# Patient Record
Sex: Female | Born: 1990 | Race: White | Hispanic: No | Marital: Single | Smoking: Never smoker
Health system: Southern US, Community
[De-identification: ages and names within clinical notes are randomized; demographics above are authoritative.]

## PROBLEM LIST (undated history)

## (undated) DIAGNOSIS — G44209 Tension-type headache, unspecified, not intractable: Secondary | ICD-10-CM

## (undated) DIAGNOSIS — F410 Panic disorder [episodic paroxysmal anxiety] without agoraphobia: Secondary | ICD-10-CM

## (undated) HISTORY — PX: WISDOM TOOTH EXTRACTION: SHX21

---

## 2012-07-18 ENCOUNTER — Encounter (HOSPITAL_COMMUNITY): Payer: Self-pay | Admitting: Family Medicine

## 2012-07-18 ENCOUNTER — Emergency Department (HOSPITAL_COMMUNITY)
Admission: EM | Admit: 2012-07-18 | Discharge: 2012-07-18 | Disposition: A | Discharge status: Home / Self Care | Payer: Managed Care, Other (non HMO) | Attending: Emergency Medicine | Admitting: Emergency Medicine

## 2012-07-18 ENCOUNTER — Emergency Department (HOSPITAL_COMMUNITY): Payer: Managed Care, Other (non HMO)

## 2012-07-18 DIAGNOSIS — R112 Nausea with vomiting, unspecified: Secondary | ICD-10-CM | POA: Insufficient documentation

## 2012-07-18 DIAGNOSIS — R197 Diarrhea, unspecified: Secondary | ICD-10-CM

## 2012-07-18 DIAGNOSIS — Z3202 Encounter for pregnancy test, result negative: Secondary | ICD-10-CM | POA: Insufficient documentation

## 2012-07-18 DIAGNOSIS — R109 Unspecified abdominal pain: Secondary | ICD-10-CM

## 2012-07-18 DIAGNOSIS — R1084 Generalized abdominal pain: Secondary | ICD-10-CM | POA: Insufficient documentation

## 2012-07-18 LAB — CBC WITH DIFFERENTIAL/PLATELET
Basophils Absolute: 0 10*3/uL (ref 0.0–0.1)
Basophils Relative: 0 % (ref 0–1)
Eosinophils Absolute: 0 10*3/uL (ref 0.0–0.7)
Eosinophils Relative: 0 % (ref 0–5)
HCT: 38.7 % (ref 36.0–46.0)
Hemoglobin: 13.1 g/dL (ref 12.0–15.0)
Lymphocytes Relative: 8 % — ABNORMAL LOW (ref 12–46)
Lymphs Abs: 0.4 10*3/uL — ABNORMAL LOW (ref 0.7–4.0)
MCH: 29.6 pg (ref 26.0–34.0)
MCHC: 33.9 g/dL (ref 30.0–36.0)
MCV: 87.4 fL (ref 78.0–100.0)
Monocytes Absolute: 0.3 10*3/uL (ref 0.1–1.0)
Monocytes Relative: 5 % (ref 3–12)
Neutro Abs: 4.6 10*3/uL (ref 1.7–7.7)
Neutrophils Relative %: 87 % — ABNORMAL HIGH (ref 43–77)
Platelets: 175 10*3/uL (ref 150–400)
RBC: 4.43 MIL/uL (ref 3.87–5.11)
RDW: 12.8 % (ref 11.5–15.5)
WBC: 5.3 10*3/uL (ref 4.0–10.5)

## 2012-07-18 LAB — POCT I-STAT, CHEM 8
Glucose, Bld: 138 mg/dL — ABNORMAL HIGH (ref 70–99)
HCT: 45 % (ref 36.0–46.0)
Hemoglobin: 15.3 g/dL — ABNORMAL HIGH (ref 12.0–15.0)
Potassium: 3.6 mEq/L (ref 3.5–5.1)
Sodium: 141 mEq/L (ref 135–145)
TCO2: 21 mmol/L (ref 0–100)

## 2012-07-18 LAB — URINALYSIS, ROUTINE W REFLEX MICROSCOPIC
Nitrite: NEGATIVE
Specific Gravity, Urine: 1.019 (ref 1.005–1.030)
Urobilinogen, UA: 0.2 mg/dL (ref 0.0–1.0)

## 2012-07-18 LAB — COMPREHENSIVE METABOLIC PANEL
ALT: 11 U/L (ref 0–35)
AST: 14 U/L (ref 0–37)
Albumin: 3 g/dL — ABNORMAL LOW (ref 3.5–5.2)
Alkaline Phosphatase: 38 U/L — ABNORMAL LOW (ref 39–117)
BUN: 4 mg/dL — ABNORMAL LOW (ref 6–23)
CO2: 19 mEq/L (ref 19–32)
Calcium: 7.7 mg/dL — ABNORMAL LOW (ref 8.4–10.5)
Chloride: 108 mEq/L (ref 96–112)
Creatinine, Ser: 0.59 mg/dL (ref 0.50–1.10)
GFR calc Af Amer: >90 mL/min (ref >90)
GFR calc non Af Amer: >90 mL/min (ref >90)
Glucose, Bld: 105 mg/dL — ABNORMAL HIGH (ref 70–99)
Potassium: 3.5 mEq/L (ref 3.5–5.1)
Sodium: 139 mEq/L (ref 135–145)
Total Bilirubin: 0.7 mg/dL (ref 0.3–1.2)
Total Protein: 5.8 g/dL — ABNORMAL LOW (ref 6.0–8.3)

## 2012-07-18 LAB — WET PREP, GENITAL
Clue Cells Wet Prep HPF POC: NONE SEEN
Trich, Wet Prep: NONE SEEN
Yeast Wet Prep HPF POC: NONE SEEN

## 2012-07-18 LAB — CG4 I-STAT (LACTIC ACID): Lactic Acid, Venous: 1.48 mmol/L (ref 0.5–2.2)

## 2012-07-18 MED ORDER — MORPHINE SULFATE 4 MG/ML IJ SOLN
4.0000 mg | Freq: Once | INTRAMUSCULAR | Status: AC
  Administered 2012-07-18: 4 mg via INTRAVENOUS
  Filled 2012-07-18: qty 1

## 2012-07-18 MED ORDER — ONDANSETRON HCL 4 MG/2ML IJ SOLN: 4.0000 mg | Freq: Once | INTRAMUSCULAR | Status: DC

## 2012-07-18 MED ORDER — SODIUM CHLORIDE 0.9 % IV BOLUS (SEPSIS)
1000.0000 mL | Freq: Once | INTRAVENOUS | Status: AC
  Administered 2012-07-18: 1000 mL via INTRAVENOUS

## 2012-07-18 MED ORDER — IOHEXOL 300 MG/ML  SOLN
50.0000 mL | Freq: Once | INTRAMUSCULAR | Status: AC | PRN
  Administered 2012-07-18: 50 mL via INTRAVENOUS

## 2012-07-18 MED ORDER — ONDANSETRON HCL 4 MG PO TABS: 4.0000 mg | ORAL_TABLET | Freq: Three times a day (TID) | ORAL | Status: DC | PRN

## 2012-07-18 MED ORDER — DICYCLOMINE HCL 20 MG PO TABS: 20.0000 mg | ORAL_TABLET | Freq: Two times a day (BID) | ORAL | Status: DC | PRN

## 2012-07-18 MED ORDER — TRAMADOL HCL 50 MG PO TABS: 50.0000 mg | ORAL_TABLET | Freq: Four times a day (QID) | ORAL | Status: DC | PRN

## 2012-07-18 MED ORDER — IOHEXOL 300 MG/ML  SOLN
100.0000 mL | Freq: Once | INTRAMUSCULAR | Status: AC | PRN
  Administered 2012-07-18: 100 mL via INTRAVENOUS

## 2012-07-18 MED ORDER — KETOROLAC TROMETHAMINE 30 MG/ML IJ SOLN
15.0000 mg | Freq: Once | INTRAMUSCULAR | Status: AC
  Administered 2012-07-18: 15 mg via INTRAVENOUS
  Filled 2012-07-18: qty 1

## 2012-07-18 MED ORDER — ONDANSETRON HCL 4 MG/2ML IJ SOLN
4.0000 mg | Freq: Once | INTRAMUSCULAR | Status: AC
  Administered 2012-07-18: 4 mg via INTRAVENOUS
  Filled 2012-07-18: qty 2

## 2012-07-18 MED ORDER — LOPERAMIDE HCL 2 MG PO CAPS
2.0000 mg | ORAL_CAPSULE | Freq: Four times a day (QID) | ORAL | Status: DC | PRN
Start: 2012-07-18 — End: 2012-10-15

## 2012-07-18 NOTE — ED Provider Notes (Signed)
History     CSN: [id number]  Arrival date & time 07/18/12  0402   First MD Initiated Contact with Patient 07/18/12 [phone]      Chief Complaint  Patient presents with  . Nausea  . Emesis  . Diarrhea    (Consider location/radiation/quality/duration/timing/severity/associated sxs/prior treatment) HPI Sherry Love is a 22 y.o. female with no significant past medical history presents emergency department complaining of nausea vomiting and diarrhea.  Onset of symptoms began approximately 5 hours prior to arrival.  Patient actively vomiting in triage.  Patient describes diffuse abdominal cramping but denies any localized abdominal pain.  She denies any fevers night sweats, chills, sick contacts, recent travel, chest pain, shortness of breath.  No other known associated symptoms.  History reviewed. No pertinent past medical history.  Past Surgical History  Procedure Laterality Date  . Wisdom tooth extraction      No family history on file.  History  Substance Use Topics  . Smoking status: Not on file  . Smokeless tobacco: Not on file  . Alcohol Use: Yes     Comment: Ocassional     OB History   Grav Para Term Preterm Abortions TAB SAB Ect Mult Living                  Review of Systems Ten systems reviewed and are negative for acute change, except as noted in the HPI.    Allergies  Penicillins  Home Medications   Current Outpatient Rx  Name  Route  Sig  Dispense  Refill  . butalbital-acetaminophen-caffeine (FIORICET, ESGIC) 50-325-40 MG per tablet   Oral   Take 1 tablet by mouth 2 (two) times daily as needed for headache.         . Drospirenone-Ethinyl Estradiol (VESTURA PO)   Oral   Take 1 tablet by mouth every morning.           BP 127/78  Pulse 111  Temp(Src) 97.9 F (36.6 C)  Resp 18  SpO2 99%  LMP 07/08/2012  Physical Exam  Nursing note and vitals reviewed. Constitutional: She is oriented to person, place, and time. She appears well-developed and  well-nourished. No distress.  HENT:  Head: Normocephalic and atraumatic.  Mouth/Throat: Oropharynx is clear and moist. No oropharyngeal exudate.  Eyes: Conjunctivae and EOM are normal. Pupils are equal, round, and reactive to light. No scleral icterus.  Neck: Normal range of motion. Neck supple. No tracheal deviation present. No thyromegaly present.  Cardiovascular: Regular rhythm, normal heart sounds and intact distal pulses.   Tachycardic  Pulmonary/Chest: Effort normal and breath sounds normal. No stridor. No respiratory distress. She has no wheezes.  Abdominal: Soft. There is tenderness (diffuse).  Musculoskeletal: Normal range of motion. She exhibits no edema and no tenderness.  Neurological: She is alert and oriented to person, place, and time. Coordination normal.  Skin: Skin is warm and dry. No rash noted. She is not diaphoretic. No erythema. No pallor.  Psychiatric: She has a normal mood and affect. Her behavior is normal.    ED Course  Procedures (including critical care time)  Labs Reviewed  POCT I-STAT, CHEM 8 - Abnormal; Notable for the following:    Glucose, Bld 138 (*)    Hemoglobin 15.3 (*)    All other components within normal limits  URINALYSIS, ROUTINE W REFLEX MICROSCOPIC   No results found.   No diagnosis found.    MDM  Patient with symptoms consistent with viral gastroenteritis.  Vitals are stable, no  fever.  No signs of dehydration, tolerating PO fluids > 6 oz.  Lungs are clear.  No focal abdominal pain, no concern for appendicitis, cholecystitis, pancreatitis, ruptured viscus, UTI, kidney stone, or any other abdominal etiology.  Supportive therapy indicated with return if symptoms worsen.  Patient counseled.         Jaci Carrel, [place] 07/21/12 [phone]

## 2012-07-18 NOTE — ED Provider Notes (Signed)
7:14 AM Sign out received from [facility] [place], [place], at change of shift.  Patient is young, healthy woman with N/V/D that began a few hours prior to arrival.  Plan is for IVF, symptom management, PO trial, and d/c home.  Pt reports she is feeling much better, not currently vomiting, abdominal pain remains diffuse.  Pt is tachycardic in 110s.  Will continue IVF.  Abdomen is nondistended, soft, diffusely tender, no guarding, no rebound. No hx abdominal surgeries.   9:34 AM Pt remains tachycardic to 122.  Abdomen remains benign: soft, nondistended, diffusely tender, worse in lower abdomen, no guarding, no rebound.  No tolerating PO.  Denies CP, SOB, cough.  Will continue IVF, PO fluids.    11:01 AM Pt remains tachycardic at 118.  Abdominal exam remains unchanged.  No longer vomiting.  Continues to have diarrhea.  I have discussed pt with Dr Juleen China who will also see and examine patient.  I have discussed strict return precautions with patient for fever, uncontrolled vomiting, localizing or changing abdominal pain.    11:14 AM Pt has been seen by and examined by Dr Juleen China who will perform pelvic exam and has ordered CT abd/pelvis.  Further treatment and dispo by Dr Juleen China.    Results for orders placed during the hospital encounter of 07/18/12  WET PREP, GENITAL      Result Value Range   Yeast Wet Prep HPF POC NONE SEEN  NONE SEEN   Trich, Wet Prep NONE SEEN  NONE SEEN   Clue Cells Wet Prep HPF POC NONE SEEN  NONE SEEN   WBC, Wet Prep HPF POC FEW (*) NONE SEEN  URINALYSIS, ROUTINE W REFLEX MICROSCOPIC      Result Value Range   Color, Urine YELLOW  YELLOW   APPearance CLEAR  CLEAR   Specific Gravity, Urine 1.019  1.005 - 1.030   pH 5.0  5.0 - 8.0   Glucose, UA NEGATIVE  NEGATIVE mg/dL   Hgb urine dipstick NEGATIVE  NEGATIVE   Bilirubin Urine NEGATIVE  NEGATIVE   Ketones, ur 40 (*) NEGATIVE mg/dL   Protein, ur NEGATIVE  NEGATIVE mg/dL   Urobilinogen, UA 0.2  0.0 - 1.0 mg/dL   Nitrite NEGATIVE  NEGATIVE    Leukocytes, UA NEGATIVE  NEGATIVE  COMPREHENSIVE METABOLIC PANEL      Result Value Range   Sodium 139  135 - 145 mEq/L   Potassium 3.5  3.5 - 5.1 mEq/L   Chloride 108  96 - 112 mEq/L   CO2 19  19 - 32 mEq/L   Glucose, Bld 105 (*) 70 - 99 mg/dL   BUN 4 (*) 6 - 23 mg/dL   Creatinine, Ser 9.60  0.50 - 1.10 mg/dL   Calcium 7.7 (*) 8.4 - 10.5 mg/dL   Total Protein 5.8 (*) 6.0 - 8.3 g/dL   Albumin 3.0 (*) 3.5 - 5.2 g/dL   AST 14  0 - 37 U/L   ALT 11  0 - 35 U/L   Alkaline Phosphatase 38 (*) 39 - 117 U/L   Total Bilirubin 0.7  0.3 - 1.2 mg/dL   GFR calc non Af Amer >90  >90 mL/min   GFR calc Af Amer >90  >90 mL/min  CBC WITH DIFFERENTIAL      Result Value Range   WBC 5.3  4.0 - 10.5 K/uL   RBC 4.43  3.87 - 5.11 MIL/uL   Hemoglobin 13.1  12.0 - 15.0 g/dL   HCT 45.4  09.8 -  46.0 %   MCV 87.4  78.0 - 100.0 fL   MCH 29.6  26.0 - 34.0 pg   MCHC 33.9  30.0 - 36.0 g/dL   RDW 16.1  09.6 - 04.5 %   Platelets 175  150 - 400 K/uL   Neutrophils Relative % 87 (*) 43 - 77 %   Neutro Abs 4.6  1.7 - 7.7 K/uL   Lymphocytes Relative 8 (*) 12 - 46 %   Lymphs Abs 0.4 (*) 0.7 - 4.0 K/uL   Monocytes Relative 5  3 - 12 %   Monocytes Absolute 0.3  0.1 - 1.0 K/uL   Eosinophils Relative 0  0 - 5 %   Eosinophils Absolute 0.0  0.0 - 0.7 K/uL   Basophils Relative 0  0 - 1 %   Basophils Absolute 0.0  0.0 - 0.1 K/uL  POCT I-STAT, CHEM 8      Result Value Range   Sodium 141  135 - 145 mEq/L   Potassium 3.6  3.5 - 5.1 mEq/L   Chloride 109  96 - 112 mEq/L   BUN 8  6 - 23 mg/dL   Creatinine, Ser 4.09  0.50 - 1.10 mg/dL   Glucose, Bld 811 (*) 70 - 99 mg/dL   Calcium, Ion 9.14  7.82 - 1.23 mmol/L   TCO2 21  0 - 100 mmol/L   Hemoglobin 15.3 (*) 12.0 - 15.0 g/dL   HCT 95.6  21.3 - 08.6 %  POCT PREGNANCY, URINE      Result Value Range   Preg Test, Ur NEGATIVE  NEGATIVE  CG4 I-STAT (LACTIC ACID)      Result Value Range   Lactic Acid, Venous 1.48  0.5 - 2.2 mmol/L   Ct Abdomen Pelvis W  Contrast  07/18/2012   *RADIOLOGY REPORT*  Clinical Data: Lower abdominal pain and emesis.  Nausea vomiting and diarrhea for past 5 hours.  CT ABDOMEN AND PELVIS WITH CONTRAST  Technique:  Multidetector CT imaging of the abdomen and pelvis was performed following the standard protocol during bolus administration of intravenous contrast.  Contrast: 50mL OMNIPAQUE IOHEXOL 300 MG/ML  SOLN, OMNIPAQUE IOHEXOL 300 MG/ML  SOLN  Comparison: None.  Findings: The lung bases are clear.  Heart size is normal. Negative for pleural or pericardial effusion.  Focal non mass-like area of hypodensity adjacent to the falciform ligament of the liver likely reflects focal fatty infiltration.  No suspicious hepatic mass.  Liver normal in size.  Portal vein is patent.  Negative for biliary ductal dilatation.  The spleen, adrenal glands, kidneys, and pancreas are normal. Distal small bowel loops and the colon contain fluid, consistent with history of diarrhea. Bowel loops are not distended.  There is no focal bowel wall thickening.  The appendix is visualized, normal in caliber 4 mm, and there are no peri-appendiceal inflammatory changes.  Urinary bladder, uterus, and adnexa are normal. Abdominal aorta normal in caliber.  Vertebral bodies are normal in height and alignment.  No acute or suspicious bony abnormality.  IMPRESSION:  1. 1. Fluid within small bowel and colon is consistent with history of diarrhea.  No evidence of bowel obstruction or bowel wall thickening. 2.  Normal caliber appendix. 3.  Negative for mass lesion or lymphadenopathy.   Original Report Authenticated By: Britta Mccreedy, M.D.      Trixie Dredge, PA-C 07/18/12 1355

## 2012-07-18 NOTE — ED Notes (Signed)
Patient forgot to catch urine

## 2012-07-18 NOTE — ED Provider Notes (Signed)
Medical screening examination/treatment/procedure(s) were conducted as a shared visit with non-physician practitioner(s) and myself.  I personally evaluated the patient during the encounter.  11:17 AM  Pt evaluated. She is diffusely tender. This is worse across lower abdomen, but does not lateralize. Pt significantly tachycardic and hypotensive on arrival. BP has improved. HR still up to 130s while I was in the room despite 3L NS. No significant past medical or surgical history. Pt is sexually active. She denies any abdominal pain or vaginal discharge. Pregnancy test negative. This may be a viral gastroenteritis, but I have concerns about amount of tenderness I am eliciting on my exam. Will obtain additional labs. Pelvic examination. CT a/p. Continue to monitor/reassess.  12:03 PM Pelvic with normal external female genitalia. No concerning lesions noted. Pt does have cervicitis, but not particularly a lot of discharge. Mild CMT/R adnexal tender. Recommended STD tx to pt. She is deferring until cultures completed. Understands possibility of false negative.   Raeford Razor, MD 07/20/12 [phone]

## 2012-07-18 NOTE — ED Notes (Signed)
Patient states that she has had nausea, vomiting and diarrhea for the past 5 hours. Patient actively vomiting at triage.

## 2012-07-18 NOTE — ED Notes (Signed)
Pt sts she is unable to urinate.

## 2012-07-18 NOTE — ED Notes (Signed)
Pt has ambulated to bathroom to void.  Unassisted but mother walking with her.

## 2012-07-19 LAB — GC/CHLAMYDIA PROBE AMP
CT Probe RNA: NEGATIVE
GC Probe RNA: NEGATIVE

## 2012-07-20 NOTE — ED Provider Notes (Signed)
Medical screening examination/treatment/procedure(s) were conducted as a shared visit with non-physician practitioner(s) and myself.  I personally evaluated the patient during the encounter.  Please see completed note.  Raeford Razor, MD 07/20/12 1344

## 2012-07-24 LAB — CULTURE, BLOOD (ROUTINE X 2)
Culture: NO GROWTH
Culture: NO GROWTH

## 2012-08-02 NOTE — ED Provider Notes (Signed)
Medical screening examination/treatment/procedure(s) were performed by non-physician practitioner and as supervising physician I was immediately available for consultation/collaboration.  John-Adam Reni Hausner, M.D.   John-Adam Alfonso Shackett, MD 08/02/12 0208 

## 2012-10-15 ENCOUNTER — Encounter (HOSPITAL_COMMUNITY): Payer: Self-pay | Admitting: Emergency Medicine

## 2012-10-15 ENCOUNTER — Emergency Department (HOSPITAL_COMMUNITY)
Admission: EM | Admit: 2012-10-15 | Discharge: 2012-10-15 | Disposition: A | Discharge status: Home / Self Care | Payer: Managed Care, Other (non HMO) | Attending: Emergency Medicine | Admitting: Emergency Medicine

## 2012-10-15 DIAGNOSIS — K529 Noninfective gastroenteritis and colitis, unspecified: Secondary | ICD-10-CM

## 2012-10-15 DIAGNOSIS — Z8659 Personal history of other mental and behavioral disorders: Secondary | ICD-10-CM | POA: Insufficient documentation

## 2012-10-15 DIAGNOSIS — Z880 Allergy status to penicillin: Secondary | ICD-10-CM | POA: Insufficient documentation

## 2012-10-15 DIAGNOSIS — R197 Diarrhea, unspecified: Secondary | ICD-10-CM | POA: Insufficient documentation

## 2012-10-15 DIAGNOSIS — R112 Nausea with vomiting, unspecified: Secondary | ICD-10-CM | POA: Insufficient documentation

## 2012-10-15 DIAGNOSIS — K5289 Other specified noninfective gastroenteritis and colitis: Secondary | ICD-10-CM | POA: Insufficient documentation

## 2012-10-15 HISTORY — DX: Tension-type headache, unspecified, not intractable: G44.209

## 2012-10-15 HISTORY — DX: Panic disorder (episodic paroxysmal anxiety): F41.0

## 2012-10-15 LAB — BASIC METABOLIC PANEL
BUN: 7 mg/dL (ref 6–23)
CO2: 20 mEq/L (ref 19–32)
Calcium: 9 mg/dL (ref 8.4–10.5)
Glucose, Bld: 92 mg/dL (ref 70–99)
Sodium: 134 mEq/L — ABNORMAL LOW (ref 135–145)

## 2012-10-15 LAB — URINALYSIS, ROUTINE W REFLEX MICROSCOPIC
Bilirubin Urine: NEGATIVE
Glucose, UA: NEGATIVE mg/dL
Hgb urine dipstick: NEGATIVE
Ketones, ur: NEGATIVE mg/dL
Protein, ur: NEGATIVE mg/dL

## 2012-10-15 LAB — URINE MICROSCOPIC-ADD ON

## 2012-10-15 MED ORDER — SODIUM CHLORIDE 0.9 % IV BOLUS (SEPSIS)
1000.0000 mL | Freq: Once | INTRAVENOUS | Status: AC
  Administered 2012-10-15: 1000 mL via INTRAVENOUS

## 2012-10-15 MED ORDER — ONDANSETRON HCL 4 MG/2ML IJ SOLN
4.0000 mg | Freq: Once | INTRAMUSCULAR | Status: AC
  Administered 2012-10-15: 4 mg via INTRAVENOUS
  Filled 2012-10-15: qty 2

## 2012-10-15 MED ORDER — ONDANSETRON 4 MG PO TBDP: ORAL_TABLET | ORAL | Status: DC

## 2012-10-15 MED ORDER — LORAZEPAM 2 MG/ML IJ SOLN: 1.0000 mg | Freq: Once | INTRAMUSCULAR | Status: DC

## 2012-10-15 NOTE — ED Notes (Addendum)
Patient reports that yesterday she began feeling abdominal pain, then later got dizzy in the shower and "passed out for 5 seconds," but was caught by boyfriend before falling. Patient reports that she felt a panic attack after she woke up and that that yesterday began symptoms of nausea and vomiting after this event. Patient reports some SOB yesterday, which she believes was due to anxiety.

## 2012-10-15 NOTE — ED Provider Notes (Signed)
CSN: [id number]     Arrival date & time 10/15/12  1245 History     First MD Initiated Contact with Patient 10/15/12 1308     Chief Complaint  Patient presents with  . Abdominal Pain  . Emesis   (Consider location/radiation/quality/duration/timing/severity/associated sxs/prior Treatment) Patient is a 22 y.o. female presenting with vomiting. The history is provided by the patient.  Emesis Severity:  Moderate Duration:  2 days Timing:  Constant Number of daily episodes:  5 Quality:  Stomach contents Feeding tolerance: nothing. Progression:  Unchanged Chronicity:  New Recent urination:  Normal Context: not post-tussive   Relieved by:  Nothing Worsened by:  Nothing tried Ineffective treatments:  None tried Associated symptoms: diarrhea   Associated symptoms: no abdominal pain, no chills, no cough and no fever     Past Medical History  Diagnosis Date  . Anxiety attack   . Tension headache    Past Surgical History  Procedure Laterality Date  . Wisdom tooth extraction     No family history on file. History  Substance Use Topics  . Smoking status: Not on file  . Smokeless tobacco: Not on file  . Alcohol Use: Yes     Comment: Ocassional    OB History   Grav Para Term Preterm Abortions TAB SAB Ect Mult Living                 Review of Systems  Constitutional: Negative for fever and chills.  Respiratory: Negative for cough and shortness of breath.   Gastrointestinal: Positive for vomiting and diarrhea. Negative for abdominal pain.  All other systems reviewed and are negative.    Allergies  Penicillins  Home Medications   Current Outpatient Rx  Name  Route  Sig  Dispense  Refill  . butalbital-acetaminophen-caffeine (FIORICET, ESGIC) 50-325-40 MG per tablet   Oral   Take 1 tablet by mouth 2 (two) times daily as needed for headache.         . drospirenone-ethinyl estradiol (YAZ,GIANVI,LORYNA) 3-0.02 MG tablet   Oral   Take 1 tablet by mouth at bedtime.          Marland Kitchen loratadine (CLARITIN) 10 MG tablet   Oral   Take 10 mg by mouth daily.         . Multiple Vitamin (MULTIVITAMIN WITH MINERALS) TABS tablet   Oral   Take 1 tablet by mouth daily.          BP 112/72  Pulse 106  Temp(Src) 98.3 F (36.8 C) (Oral)  Resp 15  SpO2 100% Physical Exam  Nursing note and vitals reviewed. Constitutional: She is oriented to person, place, and time. She appears well-developed and well-nourished. No distress.  HENT:  Head: Normocephalic and atraumatic.  Eyes: EOM are normal. Pupils are equal, round, and reactive to light.  Neck: Normal range of motion. Neck supple.  Cardiovascular: Normal rate and regular rhythm.  Exam reveals no friction rub.   No murmur heard. Pulmonary/Chest: Effort normal and breath sounds normal. No respiratory distress. She has no wheezes. She has no rales.  Abdominal: Soft. She exhibits no distension. There is no tenderness. There is no rebound.  Musculoskeletal: Normal range of motion. She exhibits no edema.  Neurological: She is alert and oriented to person, place, and time.  Skin: She is not diaphoretic.    ED Course   Procedures (including critical care time)  Labs Reviewed  URINALYSIS, ROUTINE W REFLEX MICROSCOPIC - Abnormal; Notable for the following:  Leukocytes, UA TRACE (*)    All other components within normal limits  BASIC METABOLIC PANEL - Abnormal; Notable for the following:    Sodium 134 (*)    All other components within normal limits  URINE MICROSCOPIC-ADD ON   No results found. 1. Gastroenteritis      Date: 10/15/2012  Rate: 87  Rhythm: normal sinus rhythm  QRS Axis: normal  Intervals: normal  ST/T Wave abnormalities: normal  Conduction Disutrbances:none  Narrative Interpretation:   Old EKG Reviewed: none available   MDM  56F presents with N/V/D since yesterday. Recent travel, but not outside the country. No recent camping, antibiotics, sick contacts. No fevers. No abdominal pain.  Mild tachycardia here. Normal blood pressures. No hypoxia. Abdomen benign. EKG normal. Concern for gastroenteritis. Will give fluids, check BMP, urine studies. BMP normal. On re-exam, symptoms greatly improved. Patient stable for discharge. Given Rx for Zofran. Instructed PCP f/u.    Dagmar Hait, MD 10/15/12 [phone]

## 2014-08-22 IMAGING — CT CT ABD-PELV W/ CM
1 of 2 series · 15 of 32 positions shown, 19 images · IV contrast (OMNIPAQUE 300)
Comparison: None.

CLINICAL DATA: Lower abdominal pain and emesis.  Nausea vomiting
and diarrhea for past 5 hours.

CT ABDOMEN AND PELVIS WITH CONTRAST
TECHNIQUE: Multidetector CT imaging of the abdomen and pelvis was
performed following the standard protocol during bolus
administration of intravenous contrast.
Contrast: 50mL OMNIPAQUE IOHEXOL 300 MG/ML  SOLN, 100mL OMNIPAQUE
IOHEXOL 300 MG/ML  SOLN

[Series 2: abd/pel with · axial · 0.69mm/px · z∈[-432,-42]mm · 15 of 86 slices shown, 19 images]
[im 4/86  soft-tissue]
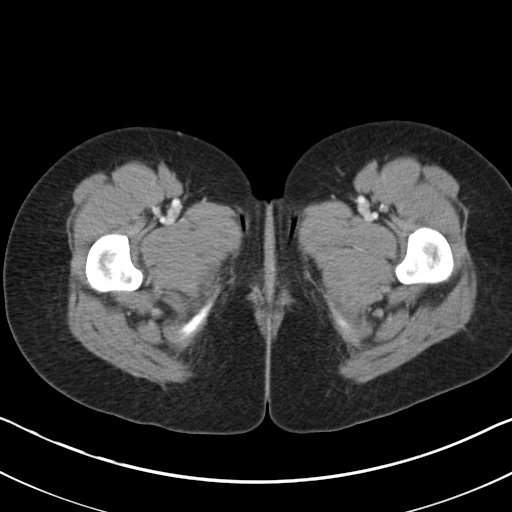
[im 4/86  bone]
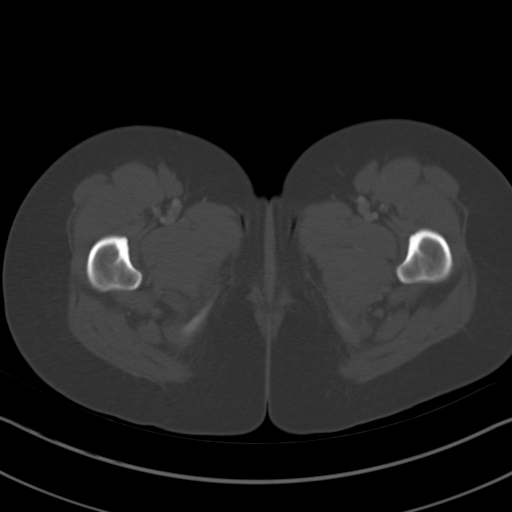
[im 11/86  soft-tissue]
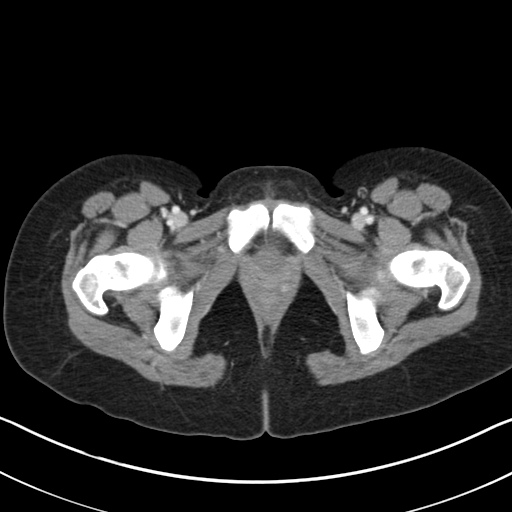
[im 18/86  soft-tissue]
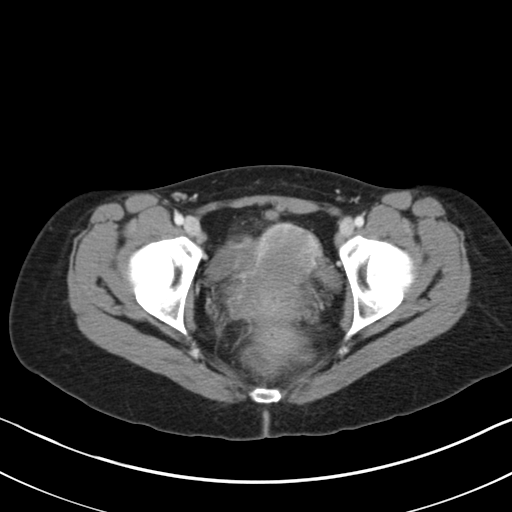
[im 24/86  soft-tissue]
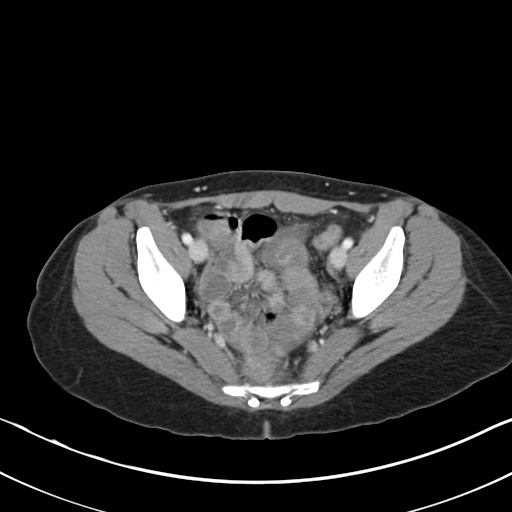
[im 31/86  soft-tissue]
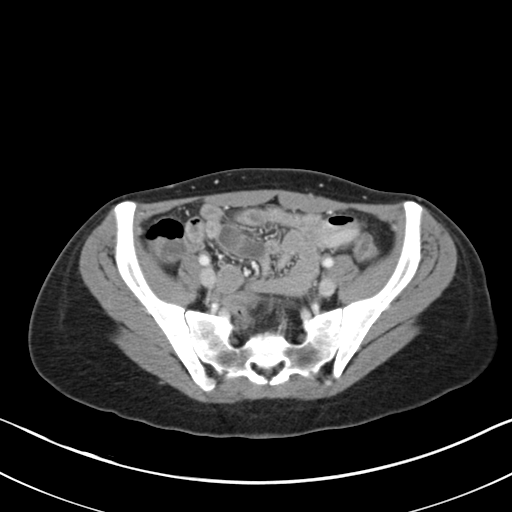
[im 38/86  soft-tissue]
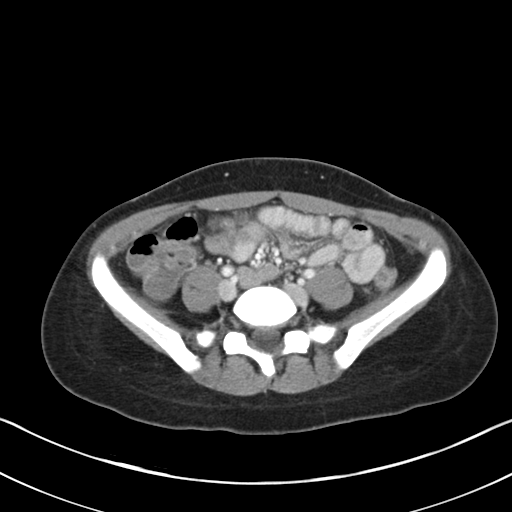
[im 45/86  soft-tissue]
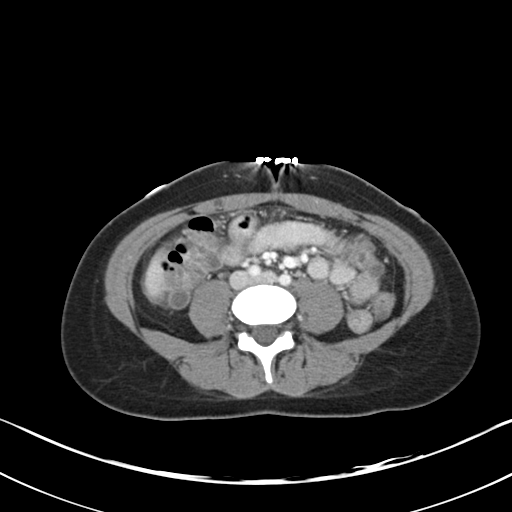
[im 48/86  soft-tissue]
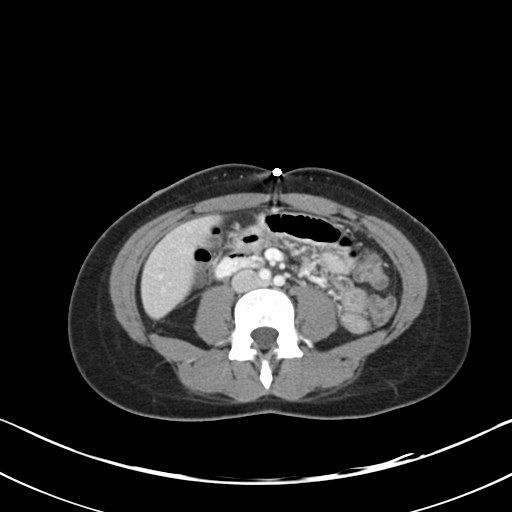
[im 55/86  soft-tissue]
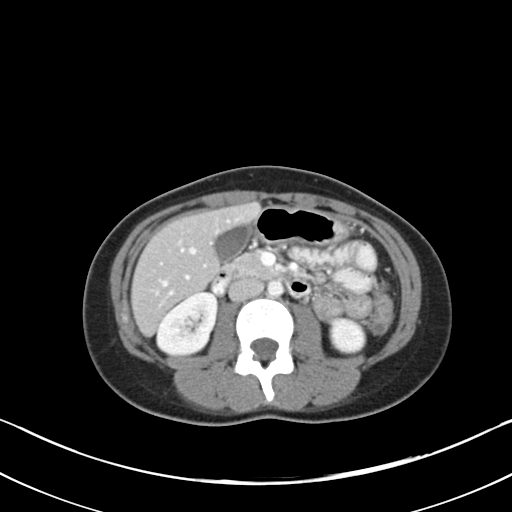
[im 55/86  bone]
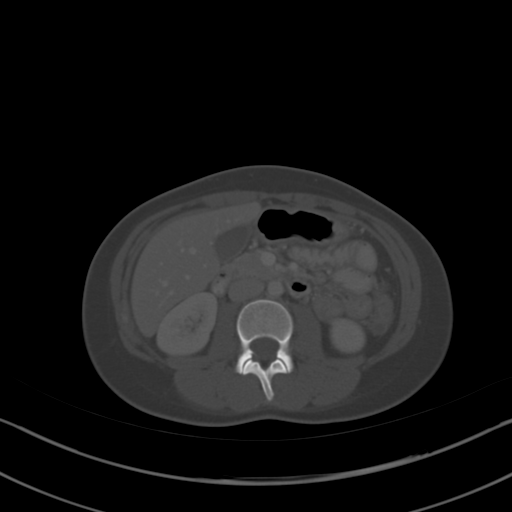
[im 62/86  soft-tissue]
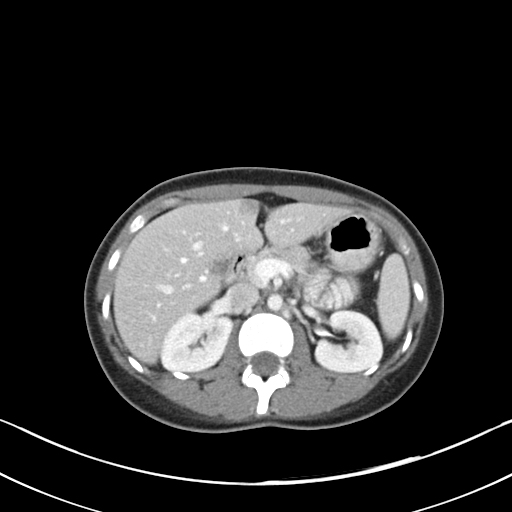
[im 69/86  soft-tissue]
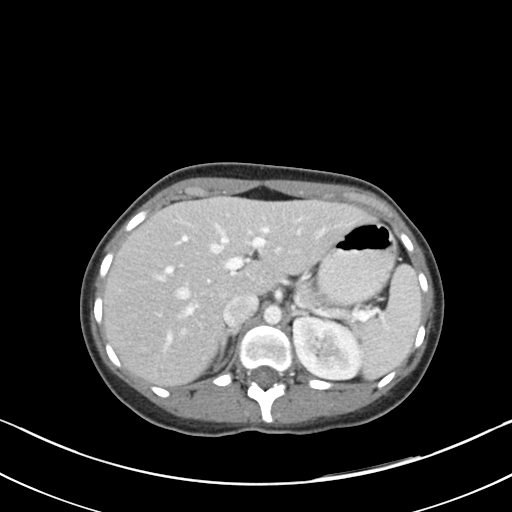
[im 72/86  lung]
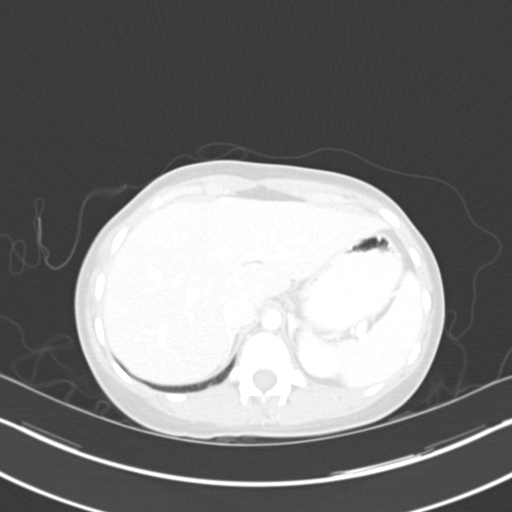
[im 75/86  soft-tissue]
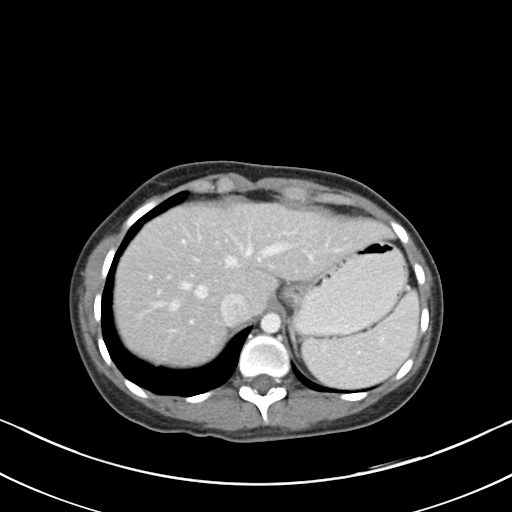
[im 75/86  lung]
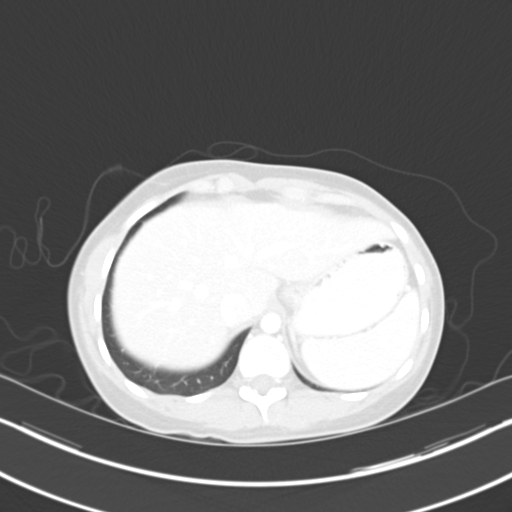
[im 79/86  lung]
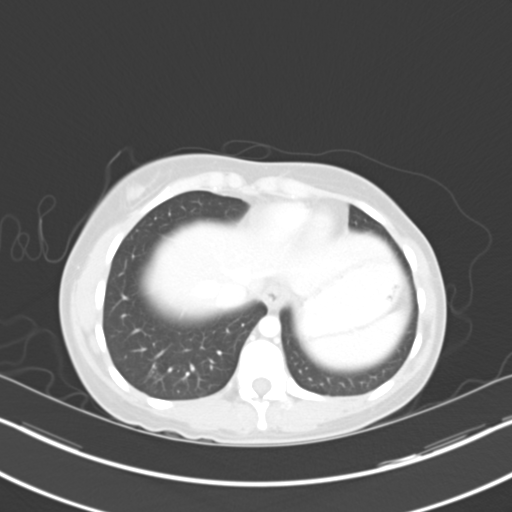
[im 82/86  soft-tissue]
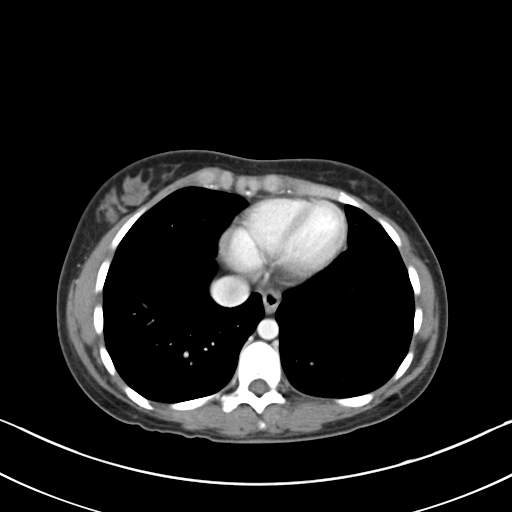
[im 82/86  lung]
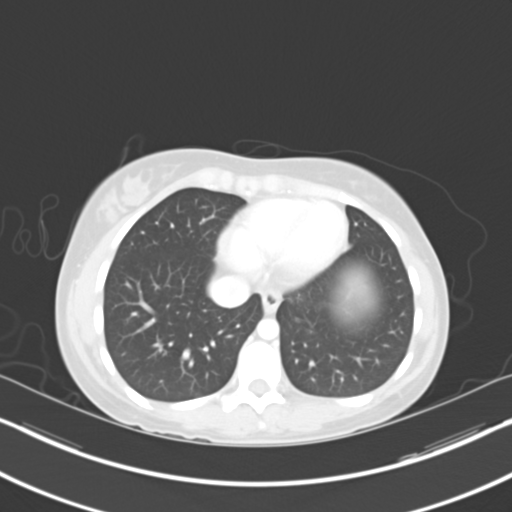

[15 of 32 positions shown; findings below may reference images not displayed]

FINDINGS: The lung bases are clear.  Heart size is normal.
Negative for pleural or pericardial effusion.

Focal non mass-like area of hypodensity adjacent to the falciform
ligament of the liver likely reflects focal fatty infiltration.  No
suspicious hepatic mass.  Liver normal in size.  Portal vein is
patent.  Negative for biliary ductal dilatation.

The spleen, adrenal glands, kidneys, and pancreas are normal.
Distal small bowel loops and the colon contain fluid, consistent
with history of diarrhea. Bowel loops are not distended.  There is
no focal bowel wall thickening.  The appendix is visualized, normal
in caliber 4 mm, and there are no peri-appendiceal inflammatory
changes.  Urinary bladder, uterus, and adnexa are normal.
Abdominal aorta normal in caliber.  Vertebral bodies are normal in
height and alignment.  No acute or suspicious bony abnormality.
IMPRESSION: 1.
1. Fluid within small bowel and colon is consistent with history of
diarrhea.  No evidence of bowel obstruction or bowel wall
thickening.
2.  Normal caliber appendix.
3.  Negative for mass lesion or lymphadenopathy.

## 2017-10-16 ENCOUNTER — Emergency Department (HOSPITAL_COMMUNITY)
Admission: EM | Admit: 2017-10-16 | Discharge: 2017-10-16 | Disposition: A | Discharge status: Home / Self Care | Payer: Managed Care, Other (non HMO) | Attending: Emergency Medicine | Admitting: Emergency Medicine

## 2017-10-16 ENCOUNTER — Encounter (HOSPITAL_COMMUNITY): Payer: Self-pay | Admitting: Emergency Medicine

## 2017-10-16 DIAGNOSIS — A084 Viral intestinal infection, unspecified: Secondary | ICD-10-CM | POA: Insufficient documentation

## 2017-10-16 DIAGNOSIS — Z79899 Other long term (current) drug therapy: Secondary | ICD-10-CM | POA: Insufficient documentation

## 2017-10-16 LAB — COMPREHENSIVE METABOLIC PANEL
ALBUMIN: 4.8 g/dL (ref 3.5–5.0)
ALT: 29 U/L (ref 0–44)
AST: 30 U/L (ref 15–41)
Alkaline Phosphatase: 48 U/L (ref 38–126)
Anion gap: 20 — ABNORMAL HIGH (ref 5–15)
BUN: 14 mg/dL (ref 6–20)
CALCIUM: 9.8 mg/dL (ref 8.9–10.3)
CHLORIDE: 105 mmol/L (ref 98–111)
CO2: 15 mmol/L — ABNORMAL LOW (ref 22–32)
Creatinine, Ser: 1 mg/dL (ref 0.44–1.00)
GFR calc Af Amer: >60 mL/min (ref >60)
GFR calc non Af Amer: >60 mL/min (ref >60)
GLUCOSE: 89 mg/dL (ref 70–99)
POTASSIUM: 3.5 mmol/L (ref 3.5–5.1)
Sodium: 140 mmol/L (ref 135–145)
TOTAL PROTEIN: 8.5 g/dL — AB (ref 6.5–8.1)
Total Bilirubin: 2 mg/dL — ABNORMAL HIGH (ref 0.3–1.2)

## 2017-10-16 LAB — URINALYSIS, ROUTINE W REFLEX MICROSCOPIC
Bilirubin Urine: NEGATIVE
Glucose, UA: NEGATIVE mg/dL
KETONES UR: 80 mg/dL — AB
NITRITE: NEGATIVE
PH: 5 (ref 5.0–8.0)
PROTEIN: 100 mg/dL — AB
Specific Gravity, Urine: 1.026 (ref 1.005–1.030)

## 2017-10-16 LAB — CBC
HCT: 46 % (ref 36.0–46.0)
HEMOGLOBIN: 15.8 g/dL — AB (ref 12.0–15.0)
MCH: 30.5 pg (ref 26.0–34.0)
MCHC: 34.3 g/dL (ref 30.0–36.0)
MCV: 88.8 fL (ref 78.0–100.0)
PLATELETS: 320 10*3/uL (ref 150–400)
RBC: 5.18 MIL/uL — AB (ref 3.87–5.11)
RDW: 12.7 % (ref 11.5–15.5)
WBC: 11.9 10*3/uL — ABNORMAL HIGH (ref 4.0–10.5)

## 2017-10-16 LAB — I-STAT BETA HCG BLOOD, ED (MC, WL, AP ONLY): I-stat hCG, quantitative: <5 m[IU]/mL (ref <5)

## 2017-10-16 LAB — LIPASE, BLOOD: LIPASE: 24 U/L (ref 11–51)

## 2017-10-16 MED ORDER — SODIUM CHLORIDE 0.9 % IV BOLUS
1000.0000 mL | Freq: Once | INTRAVENOUS | Status: AC
  Administered 2017-10-16: 1000 mL via INTRAVENOUS

## 2017-10-16 MED ORDER — ONDANSETRON HCL 4 MG PO TABS: 4.0000 mg | ORAL_TABLET | Freq: Two times a day (BID) | ORAL | 0 refills | Status: AC

## 2017-10-16 MED ORDER — ONDANSETRON HCL 4 MG/2ML IJ SOLN
4.0000 mg | Freq: Once | INTRAMUSCULAR | Status: AC
  Administered 2017-10-16: 4 mg via INTRAVENOUS
  Filled 2017-10-16: qty 2

## 2017-10-16 NOTE — ED Triage Notes (Signed)
Pt reports been having since yesterday with abd pains. Denies urinary or bowel problems.

## 2017-10-16 NOTE — ED Provider Notes (Signed)
[facility]-EMERGENCY DEPT Provider Note   CSN: [id number] Arrival date & time: 10/16/17  1447     History   Chief Complaint Chief Complaint  Patient presents with  . Emesis    HPI Sherry Love is a 27 y.o. female.  27 y.o female with a PMH of anxiety presents to the ED with a chief complaint of emesis x 12 hours. Patient states the nausea and vomiting began yesterday.She reports 10+ episodes of emesis in the past 12 hours. She also reports abdominal pain above her belly button region, she describes the pain as constant soreness worst with throwing up. There are no alleviating symptoms. She states she had a bowel movement yesterday. Patient tried to drink pepto and benadryl but states no relieve in symptoms. She denies any previous surgical history on her abdomen.Patient is currently on OCPs but states she missed a couple of pills last month. She denies any hematemesis, fever, diarrhea, chest pain, urinary or gynecological complaints.      Past Medical History:  Diagnosis Date  . Anxiety attack   . Tension headache     There are no active problems to display for this patient.   Past Surgical History:  Procedure Laterality Date  . WISDOM TOOTH EXTRACTION       OB History   None      Home Medications    Prior to Admission medications   Medication Sig Start Date End Date Taking? Authorizing Provider  butalbital-acetaminophen-caffeine (FIORICET, ESGIC) 50-325-40 MG per tablet Take 1 tablet by mouth 2 (two) times daily as needed for headache.   Yes [provider]  drospirenone-ethinyl estradiol (YAZ,GIANVI,LORYNA) 3-0.02 MG tablet Take 1 tablet by mouth at bedtime.   Yes [provider]  loratadine (CLARITIN) 10 MG tablet Take 10 mg by mouth daily.   Yes [provider]  Multiple Vitamin (MULTIVITAMIN WITH MINERALS) TABS tablet Take 1 tablet by mouth daily.   Yes [provider]  PARoxetine (PAXIL) 20 MG tablet  Take 20 mg by mouth daily.   Yes [provider]    Family History No family history on file.  Social History Social History   Tobacco Use  . Smoking status: Never Smoker  . Smokeless tobacco: Never Used  Substance Use Topics  . Alcohol use: Yes    Comment: Ocassional   . Drug use: No     Allergies   Penicillins   Review of Systems Review of Systems  Constitutional: Negative for chills and fever.  HENT: Negative for ear pain and sore throat.   Eyes: Negative for pain and visual disturbance.  Respiratory: Negative for cough and shortness of breath.   Cardiovascular: Negative for chest pain and palpitations.  Gastrointestinal: Positive for abdominal pain (generalized), nausea and vomiting (no hematemesis ). Negative for diarrhea.  Genitourinary: Negative for dysuria and hematuria.  Musculoskeletal: Negative for arthralgias and back pain.  Skin: Negative for color change and rash.  Neurological: Negative for seizures and syncope.  All other systems reviewed and are negative.    Physical Exam Updated Vital Signs BP 119/76   Pulse 91   Temp 98 F (36.7 C) (Oral)   Resp 17   LMP 09/25/2017   SpO2 100%   Physical Exam  Constitutional: She is oriented to person, place, and time. She appears well-developed and well-nourished.  HENT:  Head: Normocephalic and atraumatic.  Eyes: Pupils are equal, round, and reactive to light.  Neck: Normal range of motion. Neck supple.  Cardiovascular: Tachycardia present.  Pulmonary/Chest: Effort normal and breath sounds normal. She has no wheezes.  Abdominal: Soft. She exhibits no distension. There is no tenderness. There is no guarding.  Musculoskeletal: She exhibits no tenderness or deformity.  Neurological: She is alert and oriented to person, place, and time.  Skin: Skin is warm and dry.  Nursing note and vitals reviewed.    ED Treatments / Results  Labs (all labs ordered are listed, but only abnormal results are  displayed) Labs Reviewed  COMPREHENSIVE METABOLIC PANEL - Abnormal; Notable for the following components:      Result Value   CO2 15 (*)    Total Protein 8.5 (*)    Total Bilirubin 2.0 (*)    Anion gap 20 (*)    All other components within normal limits  CBC - Abnormal; Notable for the following components:   WBC 11.9 (*)    RBC 5.18 (*)    Hemoglobin 15.8 (*)    All other components within normal limits  URINALYSIS, ROUTINE W REFLEX MICROSCOPIC - Abnormal; Notable for the following components:   Hgb urine dipstick SMALL (*)    Ketones, ur 80 (*)    Protein, ur 100 (*)    Leukocytes, UA TRACE (*)    Bacteria, UA FEW (*)    All other components within normal limits  LIPASE, BLOOD  I-STAT BETA HCG BLOOD, ED ([facility], [facility], [facility] ONLY)    EKG None  Radiology No results found.  Procedures Procedures (including critical care time)  Medications Ordered in ED Medications  sodium chloride 0.9 % bolus 1,000 mL (0 mLs Intravenous Stopped 10/16/17 1630)  ondansetron (ZOFRAN) injection 4 mg (4 mg Intravenous Given 10/16/17 1536)     Initial Impression / Assessment and Plan / ED Course  I have reviewed the triage vital signs and the nursing notes.  Pertinent labs & imaging results that were available during my care of the patient were reviewed by me and considered in my medical decision making (see chart for details).     Patient presents with emesis x 12 hours. Patient presents tachycardic will hydrate with fluids, actively vomiting will give zofran IV for nausea.  CBC showed slight elevation of WBCs.She reports not tenderness in McBurneys point , she is afebrile, I believe this is less likely to be appendicitis. She has no previous history of diverticulosis, less likely to be diverticulitis had bowel movement yesterday.  UA showed small Hgb, patient just ended her cycle. Ketones present Patient's HR after fluids 85~.    6:24 PM Patient states her vomiting and nausea has stopped after  fluids and zofran, will try PO challenge.  6:24 PM Patient tolerated the PO challenge with crackers and water. At this time she states she feels better. Will discharge patient with zofran for nausea and force fluids.Vitals have been stable during her time in the ED, vitals stable for discharge. Return precautions provided.  Final Clinical Impressions(s) / ED Diagnoses   Final diagnoses:  Viral gastroenteritis    ED Discharge Orders    None       Claude MangesSoto, Yael Angerer, PA-C 10/16/17 1824    Virgina NorfolkCuratolo, Adam, DO 10/17/17 0102

## 2017-10-16 NOTE — Discharge Instructions (Addendum)
I have prescribed zofran please take for the nausea.Drink fluids such as water and gatorade for rehydration. If your symptoms worsen, please return to the ED for reevaluation. If you experience any of the following please return to the ED  You have chest pain. You feel extremely weak or you faint. You see blood in your vomit. Your vomit looks like coffee grounds. You have bloody or black stools or stools that look like tar. You have a severe headache, a stiff neck, or both. You have a rash. You have severe pain, cramping, or bloating in your abdomen. You have trouble breathing or you are breathing very quickly. Your heart is beating very quickly. Your skin feels cold and clammy. You feel confused. You have pain when you urinate. You have signs of dehydration, such as: Dark urine, very little urine, or no urine. Cracked lips. Dry mouth. Sunken eyes. Sleepiness. Weakness.

## 2017-10-16 NOTE — ED Notes (Signed)
Patient informed that a urine specimen was needed. 

## 2017-10-16 NOTE — ED Notes (Signed)
Pt given ice chips and water. Asked to try and drink to see if tolerated.

## 2017-10-16 NOTE — ED Notes (Signed)
Pt tolerated ice chips well.  This RN provided apple juice and graham crackers, asked patient to attempt intake.

## 2019-07-29 ENCOUNTER — Encounter (HOSPITAL_COMMUNITY): Payer: Self-pay | Admitting: Emergency Medicine

## 2019-07-29 ENCOUNTER — Other Ambulatory Visit: Payer: Self-pay

## 2019-07-29 ENCOUNTER — Emergency Department (HOSPITAL_COMMUNITY): Payer: Self-pay

## 2019-07-29 DIAGNOSIS — Z79899 Other long term (current) drug therapy: Secondary | ICD-10-CM | POA: Insufficient documentation

## 2019-07-29 DIAGNOSIS — K208 Other esophagitis without bleeding: Secondary | ICD-10-CM | POA: Insufficient documentation

## 2019-07-29 DIAGNOSIS — Z20822 Contact with and (suspected) exposure to covid-19: Secondary | ICD-10-CM | POA: Insufficient documentation

## 2019-07-29 LAB — BASIC METABOLIC PANEL
Anion gap: 15 (ref 5–15)
BUN: 11 mg/dL (ref 6–20)
CO2: 16 mmol/L — ABNORMAL LOW (ref 22–32)
Calcium: 8.8 mg/dL — ABNORMAL LOW (ref 8.9–10.3)
Chloride: 104 mmol/L (ref 98–111)
Creatinine, Ser: 0.68 mg/dL (ref 0.44–1.00)
GFR calc Af Amer: >60 mL/min (ref >60)
GFR calc non Af Amer: >60 mL/min (ref >60)
Glucose, Bld: 96 mg/dL (ref 70–99)
Potassium: 3.7 mmol/L (ref 3.5–5.1)
Sodium: 135 mmol/L (ref 135–145)

## 2019-07-29 LAB — CBC
HCT: 41.5 % (ref 36.0–46.0)
Hemoglobin: 13.8 g/dL (ref 12.0–15.0)
MCH: 29.8 pg (ref 26.0–34.0)
MCHC: 33.3 g/dL (ref 30.0–36.0)
MCV: 89.6 fL (ref 80.0–100.0)
Platelets: 303 10*3/uL (ref 150–400)
RBC: 4.63 MIL/uL (ref 3.87–5.11)
RDW: 13.2 % (ref 11.5–15.5)
WBC: 8.8 10*3/uL (ref 4.0–10.5)
nRBC: 0 % (ref 0.0–0.2)

## 2019-07-29 LAB — I-STAT BETA HCG BLOOD, ED (NOT ORDERABLE): I-stat hCG, quantitative: <5 m[IU]/mL (ref <5)

## 2019-07-29 LAB — TROPONIN I (HIGH SENSITIVITY): Troponin I (High Sensitivity): <2 ng/L (ref <18)

## 2019-07-29 NOTE — ED Triage Notes (Addendum)
Patient c/o central chest and upper back pain "for a few days" worsening today. Reports pain worsens when swallowing food. States recently taking doxy for ear infection but PCP stopped "in case it was causing esophageal problems." Patient tearful in triage. Denies vomiting.

## 2019-07-30 ENCOUNTER — Emergency Department (HOSPITAL_COMMUNITY): Payer: Self-pay | Admitting: Registered Nurse

## 2019-07-30 ENCOUNTER — Emergency Department (HOSPITAL_COMMUNITY)
Admission: EM | Admit: 2019-07-30 | Discharge: 2019-07-30 | Disposition: A | Discharge status: Home / Self Care | Payer: Self-pay | Attending: Emergency Medicine | Admitting: Emergency Medicine

## 2019-07-30 ENCOUNTER — Encounter (HOSPITAL_COMMUNITY)
Admission: EM | Disposition: A | Discharge status: Home / Self Care | Payer: Self-pay | Source: Home / Self Care | Attending: Emergency Medicine

## 2019-07-30 ENCOUNTER — Encounter (HOSPITAL_COMMUNITY): Payer: Self-pay | Admitting: Anesthesiology

## 2019-07-30 DIAGNOSIS — K208 Other esophagitis without bleeding: Secondary | ICD-10-CM

## 2019-07-30 HISTORY — PX: ESOPHAGOGASTRODUODENOSCOPY (EGD) WITH PROPOFOL: SHX5813

## 2019-07-30 HISTORY — PX: BIOPSY: SHX5522

## 2019-07-30 LAB — SARS CORONAVIRUS 2 BY RT PCR (HOSPITAL ORDER, PERFORMED IN ~~LOC~~ HOSPITAL LAB): SARS Coronavirus 2: NEGATIVE

## 2019-07-30 LAB — D-DIMER, QUANTITATIVE: D-Dimer, Quant: <0.27 ug/mL-FEU (ref 0.00–0.50)

## 2019-07-30 SURGERY — ESOPHAGOGASTRODUODENOSCOPY (EGD) WITH PROPOFOL
Anesthesia: Monitor Anesthesia Care

## 2019-07-30 MED ORDER — FENTANYL CITRATE (PF) 100 MCG/2ML IJ SOLN
50.0000 ug | Freq: Once | INTRAMUSCULAR | Status: AC
  Administered 2019-07-30: 50 ug via INTRAVENOUS
  Filled 2019-07-30: qty 2

## 2019-07-30 MED ORDER — PANTOPRAZOLE SODIUM 40 MG PO TBEC
40.0000 mg | DELAYED_RELEASE_TABLET | Freq: Two times a day (BID) | ORAL | 1 refills | Status: DC
Start: 2019-07-30 — End: 2019-07-30

## 2019-07-30 MED ORDER — PROPOFOL 10 MG/ML IV BOLUS
INTRAVENOUS | Status: DC | PRN
  Administered 2019-07-30: 40 mg via INTRAVENOUS
  Administered 2019-07-30: 50 mg via INTRAVENOUS
  Administered 2019-07-30 (×3): 20 mg via INTRAVENOUS
  Administered 2019-07-30: 40 mg via INTRAVENOUS

## 2019-07-30 MED ORDER — PROPOFOL 10 MG/ML IV BOLUS
INTRAVENOUS | Status: AC
  Filled 2019-07-30: qty 20

## 2019-07-30 MED ORDER — SODIUM CHLORIDE 0.9 % IV BOLUS
1000.0000 mL | Freq: Once | INTRAVENOUS | Status: AC
  Administered 2019-07-30: 1000 mL via INTRAVENOUS

## 2019-07-30 MED ORDER — SUCRALFATE 1 G PO TABS: 1.0000 g | ORAL_TABLET | Freq: Three times a day (TID) | ORAL | 1 refills | Status: AC

## 2019-07-30 MED ORDER — LIDOCAINE VISCOUS HCL 2 % MT SOLN: 15.0000 mL | OROMUCOSAL | 2 refills | Status: AC | PRN

## 2019-07-30 MED ORDER — LACTATED RINGERS IV SOLN: INTRAVENOUS | Status: DC

## 2019-07-30 MED ORDER — LORAZEPAM 2 MG/ML IJ SOLN
1.0000 mg | Freq: Once | INTRAMUSCULAR | Status: AC
  Administered 2019-07-30: 1 mg via INTRAVENOUS
  Filled 2019-07-30: qty 1

## 2019-07-30 MED ORDER — PANTOPRAZOLE SODIUM 40 MG PO TBEC: 40.0000 mg | DELAYED_RELEASE_TABLET | Freq: Two times a day (BID) | ORAL | Status: DC

## 2019-07-30 MED ORDER — SUCRALFATE 1 GM/10ML PO SUSP
1.0000 g | Freq: Three times a day (TID) | ORAL | Status: DC
  Administered 2019-07-30: 1 g via ORAL
  Filled 2019-07-30: qty 10

## 2019-07-30 MED ORDER — SODIUM CHLORIDE 0.9 % IV SOLN: INTRAVENOUS | Status: DC

## 2019-07-30 MED ORDER — SUCRALFATE 1 G PO TABS
1.0000 g | ORAL_TABLET | Freq: Three times a day (TID) | ORAL | 1 refills | Status: DC
Start: 2019-07-30 — End: 2019-07-30

## 2019-07-30 MED ORDER — LIDOCAINE 2% (20 MG/ML) 5 ML SYRINGE
INTRAMUSCULAR | Status: DC | PRN
  Administered 2019-07-30: 50 mg via INTRAVENOUS
  Administered 2019-07-30: 20 mg via INTRAVENOUS
  Administered 2019-07-30: 50 mg via INTRAVENOUS

## 2019-07-30 MED ORDER — LIDOCAINE VISCOUS HCL 2 % MT SOLN
15.0000 mL | Freq: Once | OROMUCOSAL | Status: AC
  Administered 2019-07-30: 15 mL via ORAL
  Filled 2019-07-30: qty 15

## 2019-07-30 MED ORDER — PANTOPRAZOLE SODIUM 40 MG PO TBEC: 40.0000 mg | DELAYED_RELEASE_TABLET | Freq: Two times a day (BID) | ORAL | 1 refills | Status: AC

## 2019-07-30 MED ORDER — ALUM & MAG HYDROXIDE-SIMETH 200-200-20 MG/5ML PO SUSP
30.0000 mL | Freq: Once | ORAL | Status: AC
  Administered 2019-07-30: 30 mL via ORAL
  Filled 2019-07-30: qty 30

## 2019-07-30 MED ORDER — FENTANYL CITRATE (PF) 100 MCG/2ML IJ SOLN
50.0000 ug | INTRAMUSCULAR | Status: DC | PRN
  Administered 2019-07-30: 50 ug via INTRAVENOUS
  Filled 2019-07-30: qty 2

## 2019-07-30 MED ORDER — LIDOCAINE VISCOUS HCL 2 % MT SOLN
15.0000 mL | Freq: Four times a day (QID) | OROMUCOSAL | Status: DC | PRN
  Administered 2019-07-30: 15 mL via OROMUCOSAL
  Filled 2019-07-30: qty 15

## 2019-07-30 MED ORDER — SUCRALFATE 1 GM/10ML PO SUSP: 1.0000 g | Freq: Three times a day (TID) | ORAL | Status: DC

## 2019-07-30 MED ORDER — SUCRALFATE 1 G PO TABS
1.0000 g | ORAL_TABLET | Freq: Once | ORAL | Status: AC
  Administered 2019-07-30: 1 g via ORAL
  Filled 2019-07-30: qty 1

## 2019-07-30 MED ORDER — LORAZEPAM 2 MG/ML IJ SOLN
0.5000 mg | Freq: Once | INTRAMUSCULAR | Status: AC
  Administered 2019-07-30: 0.5 mg via INTRAVENOUS
  Filled 2019-07-30: qty 1

## 2019-07-30 MED ORDER — LIDOCAINE VISCOUS HCL 2 % MT SOLN
15.0000 mL | OROMUCOSAL | 2 refills | Status: DC | PRN
Start: 2019-07-30 — End: 2019-07-30

## 2019-07-30 MED ORDER — PANTOPRAZOLE SODIUM 40 MG IV SOLR
40.0000 mg | Freq: Once | INTRAVENOUS | Status: AC
  Administered 2019-07-30: 40 mg via INTRAVENOUS
  Filled 2019-07-30: qty 40

## 2019-07-30 SURGICAL SUPPLY — 14 items

## 2019-07-30 NOTE — ED Notes (Signed)
Pt requesting more pain medication. PA aware.  

## 2019-07-30 NOTE — ED Notes (Signed)
Pt requesting pain medication. PA made aware.

## 2019-07-30 NOTE — ED Notes (Signed)
Pt updated on plan of care at this time.  

## 2019-07-30 NOTE — Consult Note (Signed)
Referring Provider:  EDP Primary Care Physician:  Shelly Rubenstein, MD Primary Gastroenterologist: Gentry Fitz  Reason for Consultation: Odynophagia, substernal chest pain  HPI: Sherry Love is a 29 y.o. female with past medical history of anxiety presented to the hospital for substernal chest pain and hyperventilation along with decreased p.o. intake.  She was started on doxycycline down 1 week ago.  She started noticing substernal chest pain around 4 days ago.  She subsequently stopped her doxycycline but she continued to had worsening substernal pain.  Described as sharp pain.  Now complaining of pain with food or water intake.  Denies any nausea or vomiting.  Denies abdominal pain.  Denies diarrhea or constipation.  She was found to be tachycardic on arrival.  No previous EGD.  Past Medical History:  Diagnosis Date  . Anxiety attack   . Tension headache     Past Surgical History:  Procedure Laterality Date  . WISDOM TOOTH EXTRACTION      Prior to Admission medications   Medication Sig Start Date End Date Taking? Authorizing Provider  drospirenone-ethinyl estradiol (YAZ,GIANVI,LORYNA) 3-0.02 MG tablet Take 1 tablet by mouth at bedtime.   Yes [provider]  Famotidine-Ca Carb-Mag Hydrox (PEPCID COMPLETE PO) Take 1 tablet by mouth daily as needed (indigestion).   Yes [provider]  ibuprofen (ADVIL) 200 MG tablet Take 600 mg by mouth every 6 (six) hours as needed for moderate pain.   Yes [provider]  loratadine (CLARITIN) 10 MG tablet Take 10 mg by mouth daily.   Yes [provider]  Multiple Vitamin (MULTIVITAMIN WITH MINERALS) TABS tablet Take 1 tablet by mouth daily.   Yes [provider]  venlafaxine XR (EFFEXOR-XR) 150 MG 24 hr capsule Take 150 mg by mouth daily with breakfast.   Yes [provider]    Scheduled Meds: Continuous Infusions: PRN Meds:.  Allergies as of 07/29/2019 - Review Complete 07/29/2019   Allergen Reaction Noted  . Penicillins Rash 07/18/2012    No family history on file.  Social History   Socioeconomic History  . Marital status: Single    Spouse name: Not on file  . Number of children: Not on file  . Years of education: Not on file  . Highest education level: Not on file  Occupational History  . Not on file  Tobacco Use  . Smoking status: Never Smoker  . Smokeless tobacco: Never Used  Substance and Sexual Activity  . Alcohol use: Yes    Comment: Ocassional   . Drug use: No  . Sexual activity: Yes  Other Topics Concern  . Not on file  Social History Narrative  . Not on file   Social Determinants of Health   Financial Resource Strain:   . Difficulty of Paying Living Expenses:   Food Insecurity:   . Worried About Programme researcher, broadcasting/film/video in the Last Year:   . Barista in the Last Year:   Transportation Needs:   . Freight forwarder (Medical):   Marland Kitchen Lack of Transportation (Non-Medical):   Physical Activity:   . Days of Exercise per Week:   . Minutes of Exercise per Session:   Stress:   . Feeling of Stress :   Social Connections:   . Frequency of Communication with Friends and Family:   . Frequency of Social Gatherings with Friends and Family:   . Attends Religious Services:   . Active Member of Clubs or Organizations:   . Attends Club or  Organization Meetings:   Marland Kitchen Marital Status:   Intimate Partner Violence:   . Fear of Current or Ex-Partner:   . Emotionally Abused:   Marland Kitchen Physically Abused:   . Sexually Abused:     Review of Systems: Review of Systems  Constitutional: Negative for chills and fever.  HENT: Negative for hearing loss and tinnitus.   Eyes: Negative for blurred vision and double vision.  Respiratory: Negative for cough and hemoptysis.   Cardiovascular: Positive for chest pain. Negative for palpitations.  Gastrointestinal: Negative for abdominal pain, blood in stool, constipation, diarrhea, heartburn, melena, nausea and  vomiting.  Genitourinary: Negative for dysuria and urgency.  Musculoskeletal: Negative for myalgias and neck pain.  Skin: Negative for itching and rash.  Neurological: Negative for seizures and loss of consciousness.  Endo/Heme/Allergies: Does not bruise/bleed easily.  Psychiatric/Behavioral: Negative for substance abuse and suicidal ideas.    Physical Exam: Vital signs: Vitals:   07/30/19 0600 07/30/19 0634  BP: 119/79 117/81  Pulse: 93 94  Resp: 17 16  Temp:    SpO2: 99% 98%     Physical Exam  Constitutional: She is oriented to person, place, and time. She appears well-developed and well-nourished. No distress.  HENT:  Head: Normocephalic and atraumatic.  Mouth/Throat: Oropharynx is clear and moist. No oropharyngeal exudate.  Eyes: EOM are normal. No scleral icterus.  Cardiovascular: Normal heart sounds.  Tachycardia noted  Pulmonary/Chest: Effort normal and breath sounds normal. No respiratory distress.  Abdominal: Soft. Bowel sounds are normal. She exhibits no distension. There is no abdominal tenderness. There is no rebound and no guarding.  Musculoskeletal:        General: No edema. Normal range of motion.     Cervical back: Normal range of motion and neck supple.  Neurological: She is alert and oriented to person, place, and time.  Skin: Skin is warm. No erythema.  Psychiatric: She has a normal mood and affect. Judgment and thought content normal.  Nursing note and vitals reviewed.  GI:  Lab Results: Recent Labs    07/29/19 2050  WBC 8.8  HGB 13.8  HCT 41.5  PLT 303   BMET Recent Labs    07/29/19 2050  NA 135  K 3.7  CL 104  CO2 16*  GLUCOSE 96  BUN 11  CREATININE 0.68  CALCIUM 8.8*   LFT No results for input(s): PROT, ALBUMIN, AST, ALT, ALKPHOS, BILITOT, BILIDIR, IBILI in the last 72 hours. PT/INR No results for input(s): LABPROT, INR in the last 72 hours.   Studies/Results: DG Chest 2 View  Result Date: 07/29/2019 CLINICAL DATA:  Chest  pain EXAM: CHEST - 2 VIEW COMPARISON:  None. FINDINGS: The heart size and mediastinal contours are within normal limits. Both lungs are clear. The visualized skeletal structures are unremarkable. IMPRESSION: No active cardiopulmonary disease. Electronically Signed   By: Donavan Foil M.D.   On: 07/29/2019 21:09    Impression/Plan: -Sharp substernal chest pain most likely odynophagia from pill induced esophagitis. -Dysphagia.  Probably from esophagitis  Recommendations ------------------------ -Conservative management with PPI and Carafate versus endoscopy discussed.  Patient prefers endoscopy for confirmation of diagnosis.  -Keep patient n.p.o. for now.  Plan for EGD today  Risks (bleeding, infection, bowel perforation that could require surgery, sedation-related changes in cardiopulmonary systems), benefits (identification and possible treatment of source of symptoms, exclusion of certain causes of symptoms), and alternatives (watchful waiting, radiographic imaging studies, empiric medical treatment)  were explained to patient in detail and patient wishes to proceed.  LOS: 0 days   Kathi Der  MD, FACP 07/30/2019, 8:06 AM  Contact #  [phone]

## 2019-07-30 NOTE — Op Note (Signed)
[facility] Patient Name: Sherry Love Procedure Date: 07/30/2019 MRN: [id number] Attending MD: Kathi Der , MD Date of Birth: 1990/04/03 CSN: [id number] Age: 29 Admit Type: Inpatient Procedure:                Upper GI endoscopy Indications:              Odynophagia Providers:                Kathi Der, MD, Dwain Sarna, RN, Everardo Pacific, Technician Referring MD:              Medicines:                Sedation Administered by an Anesthesia Professional Complications:            No immediate complications. Estimated Blood Loss:     Estimated blood loss was minimal. Procedure:                Pre-Anesthesia Assessment:                           - Prior to the procedure, a History and Physical                            was performed, and patient medications and                            allergies were reviewed. The patient's tolerance of                            previous anesthesia was also reviewed. The risks                            and benefits of the procedure and the sedation                            options and risks were discussed with the patient.                            All questions were answered, and informed consent                            was obtained. Prior Anticoagulants: The patient has                            taken no previous anticoagulant or antiplatelet                            agents. ASA Grade Assessment: II - A patient with                            mild systemic disease. After reviewing the risks  and benefits, the patient was deemed in                            satisfactory condition to undergo the procedure.                           After obtaining informed consent, the endoscope was                            passed under direct vision. Throughout the                            procedure, the patient's blood pressure, pulse, and   oxygen saturations were monitored continuously. The                            GIF-[id number] ([id number]) was introduced through the                            mouth, and advanced to the second part of duodenum.                            The upper GI endoscopy was technically difficult                            and complex due to presence of food. The patient                            tolerated the procedure well. Scope In: Scope Out: Findings:      LA Grade D (one or more mucosal breaks involving at least 75% of       esophageal circumference) esophagitis with bleeding was found in the       middle third of the esophagus. Biopsies were taken with a cold forceps       for histology.      The Z-line was regular and was found 36 cm from the incisors.      A medium amount of food (residue) was found in the gastric body.      The cardia and gastric fundus were normal on retroflexion.      The duodenal bulb, first portion of the duodenum and second portion of       the duodenum were normal. Impression:               - LA Grade D erosive esophagitis with bleeding.                            Biopsied.                           - Z-line regular, 36 cm from the incisors.                           - A medium amount of food (residue) in the stomach.                           -  Normal duodenal bulb, first portion of the                            duodenum and second portion of the duodenum. Moderate Sedation:      Moderate (conscious) sedation was personally administered by an       anesthesia professional. The following parameters were monitored: oxygen       saturation, heart rate, blood pressure, and response to care. Recommendation:           - Patient has a contact number available for                            emergencies. The signs and symptoms of potential                            delayed complications were discussed with the                            patient. Return to normal activities  tomorrow.                            Written discharge instructions were provided to the                            patient.                           - Soft diet.                           - Continue present medications.                           - Await pathology results.                           - Use Protonix (pantoprazole) 40 mg PO BID for 2                            months.                           - Use sucralfate suspension 1 gram PO BID for 4                            weeks.                           - Use Viscous Lidocaine at 2% 5 mL PO q 6 hrs for 2                            weeks.                           - Repeat upper endoscopy in 2 months to check  healing.                           - Return to GI office in 6 weeks. Procedure Code(s):        --- Professional ---                           [phone], Esophagogastroduodenoscopy, flexible,                            transoral; with biopsy, single or multiple Diagnosis Code(s):        --- Professional ---                           K20.81, Other esophagitis with bleeding                           R13.10, Dysphagia, unspecified CPT copyright 2019 American Medical Association. All rights reserved. The codes documented in this report are preliminary and upon coder review may  be revised to meet current compliance requirements. Kathi Der, MD Kathi Der, MD 07/30/2019 2:28:16 PM Number of Addenda: 0

## 2019-07-30 NOTE — Anesthesia Postprocedure Evaluation (Signed)
Anesthesia Post Note  Patient: Games developer  Procedure(s) Performed: ESOPHAGOGASTRODUODENOSCOPY (EGD) WITH PROPOFOL (N/A ) BIOPSY     Patient location during evaluation: Endoscopy Anesthesia Type: MAC Level of consciousness: awake and alert Pain management: pain level controlled Vital Signs Assessment: post-procedure vital signs reviewed and stable Respiratory status: spontaneous breathing, nonlabored ventilation, respiratory function stable and patient connected to nasal cannula oxygen Cardiovascular status: stable and blood pressure returned to baseline Postop Assessment: no apparent nausea or vomiting Anesthetic complications: no    Last Vitals:  Vitals:   07/30/19 1400 07/30/19 1436  BP: (!) 130/95 129/75  Pulse:  98  Resp: (!) 23 18  Temp: 36.4 C   SpO2:  100%    Last Pain:  Vitals:   07/30/19 1436  TempSrc:   PainSc: 0-No pain                 Effie Berkshire

## 2019-07-30 NOTE — Anesthesia Preprocedure Evaluation (Addendum)
Anesthesia Evaluation  Patient identified by MRN, date of birth, ID band Patient awake    Reviewed: Allergy & Precautions, NPO status , Patient's Chart, lab work & pertinent test results  Airway Mallampati: I  TM Distance: >3 FB Neck ROM: Full    Dental  (+) Dental Advisory Given, Chipped,    Pulmonary    breath sounds clear to auscultation       Cardiovascular negative cardio ROS   Rhythm:Regular Rate:Normal     Neuro/Psych  Headaches, Anxiety    GI/Hepatic Neg liver ROS, GERD  Medicated,  Endo/Other  negative endocrine ROS  Renal/GU negative Renal ROS     Musculoskeletal negative musculoskeletal ROS (+)   Abdominal Normal abdominal exam  (+)   Peds  Hematology negative hematology ROS (+)   Anesthesia Other Findings   Reproductive/Obstetrics                            Anesthesia Physical Anesthesia Plan  ASA: II  Anesthesia Plan: MAC   Post-op Pain Management:    Induction: Intravenous  PONV Risk Score and Plan: 0 and Propofol infusion  Airway Management Planned: Natural Airway and Nasal Cannula  Additional Equipment: None  Intra-op Plan:   Post-operative Plan:   Informed Consent: I have reviewed the patients History and Physical, chart, labs and discussed the procedure including the risks, benefits and alternatives for the proposed anesthesia with the patient or authorized representative who has indicated his/her understanding and acceptance.     Dental advisory given  Plan Discussed with: CRNA  Anesthesia Plan Comments:        Anesthesia Quick Evaluation

## 2019-07-30 NOTE — ED Provider Notes (Signed)
Received patient as a handoff at shift change from me McDonald, PA-C.  At time of handoff, work-up is complete.  I personally reviewed her comprehensive work-up which was unremarkable.  D-dimer and troponin testing were negative.  COVID-19 negative.  CBC without evidence of anemia or leukocytosis concerning for infection.  BMP shows no derangement.  I agree with handoff provider's assessment that this is likely pill esophagitis.  Patient is pending EGD to be performed this morning by gastroenterology.  Provided patient with 50 mcg fentanyl for ongoing chest pain in context of her diagnosed pill esophagitis.  She is in no acute distress on my examination, however continues to feel uncomfortable.  Dr. Levora Angel to evaluate patient in ED. awaiting EGD and their consult to determine disposition.  Patient was taken away for EGD procedure at 1:45 pm.  No acute changes and her pain has been well-controlled with fentanyl.    I personally reviewed the brief operative note by Dr. Levora Angel who notes severe LA grade D esophagitis in the mid esophagus consistent with a pill induced esophagitis.  Biopsies were taken.  Patient was cleared by gastroenterology with follow-up recommendations:  Full liquid diet, slowly advance as tolerated. Protonix 40 mg p.o. twice daily x2 months. Carafate 1 g 3-4 times daily x2 months. Viscous lidocaine 2% 15 mL every 6 hours 10 to 14 days. Repeat EGD in 2 to 3 months to document healing of ulcer. Medications while sitting upright with 8 ounces of water. Follow-up with gastroenterology in 6 weeks.  Discussed plan with patient who voices understanding is agreeable.  Strict ED return precautions discussed.  All of the evaluation and work-up results were discussed with the patient and any family at bedside. They were provided opportunity to ask any additional questions and have none at this time. They have expressed understanding of verbal discharge instructions as well as return  precautions and are agreeable to the plan.       Lorelee New, PA-C 07/30/19 1512    Dione Booze, MD 07/30/19 [phone]

## 2019-07-30 NOTE — Anesthesia Procedure Notes (Signed)
Procedure Name: MAC Date/Time: 07/30/2019 2:11 PM Performed by: Cynda Familia, CRNA Pre-anesthesia Checklist: Patient identified, Emergency Drugs available, Suction available, Patient being monitored and Timeout performed Patient Re-evaluated:Patient Re-evaluated prior to induction Oxygen Delivery Method: Simple face mask Placement Confirmation: positive ETCO2 and breath sounds checked- equal and bilateral Dental Injury: Teeth and Oropharynx as per pre-operative assessment

## 2019-07-30 NOTE — Transfer of Care (Signed)
Immediate Anesthesia Transfer of Care Note  Patient: Sherry Love  Procedure(s) Performed: ESOPHAGOGASTRODUODENOSCOPY (EGD) WITH PROPOFOL (N/A ) BIOPSY  Patient Location: PACU and Endoscopy Unit  Anesthesia Type:MAC  Level of Consciousness: awake and alert   Airway & Oxygen Therapy: Patient Spontanous Breathing and Patient connected to face mask oxygen  Post-op Assessment: Report given to RN and Post -op Vital signs reviewed and stable  Post vital signs: Reviewed and stable  Last Vitals:  Vitals Value Taken Time  BP 100/59 07/30/19 1440  Temp    Pulse 97 07/30/19 1441  Resp 17 07/30/19 1441  SpO2 100 % 07/30/19 1441  Vitals shown include unvalidated device data.  Last Pain:  Vitals:   07/30/19 1436  TempSrc:   PainSc: 0-No pain         Complications: No apparent anesthesia complications

## 2019-07-30 NOTE — Discharge Instructions (Addendum)
Please take your medications, as directed.  Take the Protonix 40 mg twice daily for 2 months.  Carafate 3-4x daily for 2 months.  Viscous lidocaine solution every 6 hours as needed to 10-14 days.  Begin with a liquid diet, you can advance as tolerated.  You will need a repeat EGD in 2-3 months to reassess.    Return to the ED or seek immediate medical attention should you experience any new or worsening symptoms.  Otherwise, plan to follow-up with gastroenterology in 6 weeks.  YOU HAD AN ENDOSCOPIC PROCEDURE TODAY: Refer to the procedure report and other information in the discharge instructions given to you for any specific questions about what was found during the examination. If this information does not answer your questions, please call [facility] office at [phone] to clarify.   YOU SHOULD EXPECT: Some feelings of bloating in the abdomen. Passage of more gas than usual. Walking can help get rid of the air that was put into your GI tract during the procedure and reduce the bloating. If you had a lower endoscopy (such as a colonoscopy or flexible sigmoidoscopy) you may notice spotting of blood in your stool or on the toilet paper. Some abdominal soreness may be present for a day or two, also.  DIET: Your first meal following the procedure should be a light meal and then it is ok to progress to your normal diet. A half-sandwich or bowl of soup is an example of a good first meal. Heavy or fried foods are harder to digest and may make you feel nauseous or bloated. Drink plenty of fluids but you should avoid alcoholic beverages for 24 hours. If you had a esophageal dilation, please see attached instructions for diet.    ACTIVITY: Your care partner should take you home directly after the procedure. You should plan to take it easy, moving slowly for the rest of the day. You can resume normal activity the day after the procedure however YOU SHOULD NOT DRIVE, use power tools, machinery or perform tasks  that involve climbing or major physical exertion for 24 hours (because of the sedation medicines used during the test).   SYMPTOMS TO REPORT IMMEDIATELY: A gastroenterologist can be reached at any hour. Please call [phone]  for any of the following symptoms:  Following lower endoscopy (colonoscopy, flexible sigmoidoscopy) Excessive amounts of blood in the stool  Significant tenderness, worsening of abdominal pains  Swelling of the abdomen that is new, acute  Fever of 100 or higher  Following upper endoscopy (EGD, EUS, ERCP, esophageal dilation) Vomiting of blood or coffee ground material  New, significant abdominal pain  New, significant chest pain or pain under the shoulder blades  Painful or persistently difficult swallowing  New shortness of breath  Black, tarry-looking or red, bloody stools  FOLLOW UP:  If any biopsies were taken you will be contacted by phone or by letter within the next 1-3 weeks. Call [phone]  if you have not heard about the biopsies in 3 weeks.  Please also call with any specific questions about appointments or follow up tests.

## 2019-07-30 NOTE — ED Notes (Signed)
Pt ambulated to restroom with one person assist.

## 2019-07-30 NOTE — ED Provider Notes (Signed)
[facility]-EMERGENCY DEPT Provider Note   CSN: [id number] Arrival date & time: 07/29/19  2038     History Chief Complaint  Patient presents with  . Chest Pain  . Back Pain    Sherry Love is a 29 y.o. female with a history of panic attacks, anxiety who presents to the emergency department with a chief complaint of chest pain.  The patient reports that approximately 1 week ago she completed a telemedicine visit for right ear pain accompanied by a "lump" behind the ear and was prescribed a course of doxycycline.  She reports that she took the first tablet of medication right before bedtime with only a couple of sips of water. When she awoke the next morning, the chest pain was present.   She was concerned that she may be having problems with her esophagus after reading the adverse reactions of the medication online, but continue to try and take the medication for the next few days.    She reports that the central chest pain was constant and felt as if something was "stuck" in her esophagus.  Reports that she has not really been able to eat or drink since the sensation began because she feels as if she cannot fully swallow.  States that she is "feeling hungry" and swallowing her saliva is painful.  However, she denies sore throat, throat closing, neck pain or stiffness, cough, orthopnea, fever, chills, nausea, vomiting, diarrhea, hematemesis, melena, hematochezia, palpitations, or leg swelling.  Since onset, pain has progressively worsened.  The pain radiates through to her mid back.  Pain is worse with laying flat and improved with sitting upright.  States that she has been breathing quickly due to the discomfort in the pain, but otherwise is not feeling short of breath.  She does note that her right ear pain  The history is provided by the patient. No language interpreter was used.       Past Medical History:  Diagnosis Date  . Anxiety attack   . Tension  headache     There are no problems to display for this patient.   Past Surgical History:  Procedure Laterality Date  . WISDOM TOOTH EXTRACTION       OB History   No obstetric history on file.     No family history on file.  Social History   Tobacco Use  . Smoking status: Never Smoker  . Smokeless tobacco: Never Used  Substance Use Topics  . Alcohol use: Yes    Comment: Ocassional   . Drug use: No    Home Medications Prior to Admission medications   Medication Sig Start Date End Date Taking? Authorizing Provider  drospirenone-ethinyl estradiol (YAZ,GIANVI,LORYNA) 3-0.02 MG tablet Take 1 tablet by mouth at bedtime.   Yes [provider]  Famotidine-Ca Carb-Mag Hydrox (PEPCID COMPLETE PO) Take 1 tablet by mouth daily as needed (indigestion).   Yes [provider]  ibuprofen (ADVIL) 200 MG tablet Take 600 mg by mouth every 6 (six) hours as needed for moderate pain.   Yes [provider]  loratadine (CLARITIN) 10 MG tablet Take 10 mg by mouth daily.   Yes [provider]  Multiple Vitamin (MULTIVITAMIN WITH MINERALS) TABS tablet Take 1 tablet by mouth daily.   Yes [provider]  venlafaxine XR (EFFEXOR-XR) 150 MG 24 hr capsule Take 150 mg by mouth daily with breakfast.   Yes [provider]    Allergies    Penicillins  Review of  Systems   Review of Systems  Constitutional: Negative for activity change, chills, diaphoresis, fatigue and fever.  HENT: Positive for ear pain and trouble swallowing. Negative for congestion, drooling, ear discharge, facial swelling, postnasal drip, sinus pain, sneezing, sore throat and voice change.   Respiratory: Negative for cough, shortness of breath and wheezing.   Cardiovascular: Positive for chest pain. Negative for palpitations and leg swelling.  Gastrointestinal: Negative for abdominal pain, anal bleeding, blood in stool, diarrhea, nausea and vomiting.  Genitourinary: Negative for  dysuria, frequency, urgency, vaginal bleeding, vaginal discharge and vaginal pain.  Musculoskeletal: Positive for back pain. Negative for arthralgias, gait problem, joint swelling, myalgias, neck pain and neck stiffness.  Skin: Negative for color change, rash and wound.  Allergic/Immunologic: Negative for immunocompromised state.  Neurological: Negative for seizures, syncope, weakness, numbness and headaches.  Psychiatric/Behavioral: Negative for confusion, dysphoric mood, hallucinations, sleep disturbance and suicidal ideas. The patient is nervous/anxious.     Physical Exam Updated Vital Signs BP 117/81 (BP Location: Right Arm)   Pulse 94   Temp 99 F (37.2 C) (Oral)   Resp 16   Ht 5\' 2"  (1.575 m)   Wt 59 kg   LMP 07/15/2019   SpO2 98%   BMI 23.78 kg/m   Physical Exam Vitals and nursing note reviewed.  Constitutional:      General: She is not in acute distress.    Appearance: She is not diaphoretic.     Comments: Uncomfortable appearing  HENT:     Head: Normocephalic.     Comments: Faint maculopapular rash noted to the posterior auricular areas bilaterally.    Mouth/Throat:     Mouth: Mucous membranes are moist.     Comments: Erythema noted to the posterior oropharynx.  No exudates.  No tonsillar edema.  Uvula is midline. Eyes:     Extraocular Movements: Extraocular movements intact.     Conjunctiva/sclera: Conjunctivae normal.     Pupils: Pupils are equal, round, and reactive to light.  Cardiovascular:     Rate and Rhythm: Regular rhythm. Tachycardia present.     Heart sounds: No murmur. No friction rub. No gallop.   Pulmonary:     Effort: No respiratory distress.     Breath sounds: No stridor. No wheezing, rhonchi or rales.     Comments: Hyperventilating.  Lungs are clear to auscultation bilaterally.  No accessory muscle use or retractions. Chest:     Chest wall: No tenderness.  Abdominal:     General: There is no distension.     Palpations: Abdomen is soft. There  is no mass.     Tenderness: There is no abdominal tenderness. There is no right CVA tenderness, left CVA tenderness, guarding or rebound.     Hernia: No hernia is present.  Musculoskeletal:     Cervical back: Normal range of motion and neck supple.     Right lower leg: No edema.     Left lower leg: No edema.  Skin:    General: Skin is warm.     Capillary Refill: Capillary refill takes less than 2 seconds.     Coloration: Skin is not jaundiced or pale.     Findings: No bruising, erythema or rash.  Neurological:     Mental Status: She is alert.  Psychiatric:        Behavior: Behavior normal.    ED Results / Procedures / Treatments   Labs (all labs ordered are listed, but only abnormal results are displayed) Labs Reviewed  BASIC METABOLIC PANEL - Abnormal; Notable for the following components:      Result Value   CO2 16 (*)    Calcium 8.8 (*)    All other components within normal limits  CBC  D-DIMER, QUANTITATIVE (NOT AT [facility])  I-STAT BETA HCG BLOOD, ED ([facility], [facility], [facility] ONLY)  I-STAT BETA HCG BLOOD, ED (NOT ORDERABLE)  TROPONIN I (HIGH SENSITIVITY)  TROPONIN I (HIGH SENSITIVITY)    EKG EKG Interpretation  Date/Time:  Friday Jul 29 2019 20:58:41 EDT Ventricular Rate:  122 PR Interval:    QRS Duration: 86 QT Interval:  304 QTC Calculation: 433 R Axis:   86 Text Interpretation: Sinus tachycardia LAE, consider biatrial enlargement RSR' in V1 or V2, probably normal variant Borderline T wave abnormalities No old tracing to compare Confirmed by Delora Fuel ([id number]) on 07/29/2019 11:32:19 PM   Radiology DG Chest 2 View  Result Date: 07/29/2019 CLINICAL DATA:  Chest pain EXAM: CHEST - 2 VIEW COMPARISON:  None. FINDINGS: The heart size and mediastinal contours are within normal limits. Both lungs are clear. The visualized skeletal structures are unremarkable. IMPRESSION: No active cardiopulmonary disease. Electronically Signed   By: Donavan Foil M.D.   On: 07/29/2019 21:09     Procedures Procedures (including critical care time)  Medications Ordered in ED Medications  sodium chloride 0.9 % bolus 1,000 mL (0 mLs Intravenous Stopped 07/30/19 0258)  LORazepam (ATIVAN) injection 1 mg (1 mg Intravenous Given 07/30/19 0130)  fentaNYL (SUBLIMAZE) injection 50 mcg (50 mcg Intravenous Given 07/30/19 0256)  alum & mag hydroxide-simeth (MAALOX/MYLANTA) 200-200-20 MG/5ML suspension 30 mL (30 mLs Oral Given 07/30/19 0347)    And  lidocaine (XYLOCAINE) 2 % viscous mouth solution 15 mL (15 mLs Oral Given 07/30/19 0347)  sucralfate (CARAFATE) tablet 1 g (1 g Oral Given 07/30/19 0347)  pantoprazole (PROTONIX) injection 40 mg (40 mg Intravenous Given 07/30/19 0456)  LORazepam (ATIVAN) injection 0.5 mg (0.5 mg Intravenous Given 07/30/19 0455)    ED Course  I have reviewed the triage vital signs and the nursing notes.  Pertinent labs & imaging results that were available during my care of the patient were reviewed by me and considered in my medical decision making (see chart for details).    MDM Rules/Calculators/A&P                      29 year old female with a history of panic attacks, anxiety who presents to the emergency department presenting with chest pain that radiates to the back, hyperventilating, globus sensation, decreased p.o. intake that began after starting doxycycline approximately 1 week ago.  The patient was seen and independently evaluated by Dr. Roxanne Mins, attending physician.  EKG was sinus tachycardia.  Chest x-ray has been reviewed by me and is unremarkable.  No leukocytosis.  Troponin is not elevated.  D-dimer is negative.  Doubt ACS, PE, aortic dissection, tension pneumothorax, or COVID-19.  Given recent doxycycline prescription with onset of symptoms, there is concern for pill esophagitis.  Doubt esophageal rupture at this time.  Doubt ruptured ulcer she is having no melena or hematochezia and hemoglobin stable.  Labs are notable for bicarb of 16.  I suspect  this is secondary to hyperventilating secondary to pain.  She was given Ativan and heart rate and hyperventilating improved.  She was given GI cocktail and Carafate.  She had minimal improvement with GI cocktail, but after swallowing Carafate pain significantly increased and she became tachycardic and was again hyperventilating.  Fentanyl  given for pain.  Protonix ordered.  On reevaluation, patient has been unable to attempt to drink more than a couple of sips of water.  She is still endorsing severe pain.  Discuss further with Dr. Preston Fleeting.  At this time, patient has now been observed in the ER for more than 10 hours.  Given that pain is significant enough to cause hyperventilation and bicarb is 16 and significantly decreased p.o. intake for the last week.  Will consult GI.  Spoke with Dr. Levora Angel, [facility], who will come and evaluate the patient in the ER.  Patient care transferred to PA Green at the end of my shift pending GI consult and recommendations. Patient presentation, ED course, and plan of care discussed with review of all pertinent labs and imaging. Please see his/her note for further details regarding further ED course and disposition.   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

## 2019-07-30 NOTE — Brief Op Note (Signed)
07/30/2019  2:30 PM  PATIENT:  Sherry Love  29 y.o. female  PRE-OPERATIVE DIAGNOSIS:  Odynophagia  POST-OPERATIVE DIAGNOSIS:  esophagitis  PROCEDURE:  Procedure(s): ESOPHAGOGASTRODUODENOSCOPY (EGD) WITH PROPOFOL (N/A) BIOPSY  SURGEON:  Surgeon(s) and Role:    * Jayquon Theiler, MD - Primary  Findings --------- -EGD showed severe LA grade D esophagitis in the mid esophagus consistent with pill induced esophagitis.  Biopsies taken  Recommendations ------------------------- -Full liquid diet and slowly advance as tolerated -Protonix 40 mg p.o. twice daily for 2 months -Carafate 1 g 3-4 times a day for 2 months -Viscous Lidocaine 2%, 15 mL every 6 hours for 10 to 14 days -Recommend repeat EGD in 2 to 3 months to document healing of ulcer -She was advised to take medication while sitting upright and with 8 ounces of water. -Okay to discharge from GI standpoint.  Follow-up in GI clinic in 6 weeks.  Kathi Der MD, FACP 07/30/2019, 2:31 PM  Contact #  [phone]

## 2019-08-02 ENCOUNTER — Encounter: Payer: Self-pay | Admitting: *Deleted

## 2019-08-02 LAB — SURGICAL PATHOLOGY
